# Patient Record
Sex: Female | Born: 1990 | Race: Black or African American | Hispanic: No | Marital: Single | Smoking: Former smoker
Health system: Southern US, Community
[De-identification: ages and names within clinical notes are randomized; demographics above are authoritative.]

## PROBLEM LIST (undated history)

## (undated) ENCOUNTER — Emergency Department (HOSPITAL_COMMUNITY): Admission: EM | Payer: Medicaid Other

## (undated) DIAGNOSIS — J45909 Unspecified asthma, uncomplicated: Secondary | ICD-10-CM

## (undated) HISTORY — PX: WISDOM TOOTH EXTRACTION: SHX21

## (undated) HISTORY — DX: Unspecified asthma, uncomplicated: J45.909

## (undated) HISTORY — PX: INCISION AND DRAINAGE: SHX5863

---

## 2019-08-08 HISTORY — PX: INCISION AND DRAINAGE ABSCESS: SHX5864

## 2020-08-28 DIAGNOSIS — S83519A Sprain of anterior cruciate ligament of unspecified knee, initial encounter: Secondary | ICD-10-CM

## 2020-08-28 HISTORY — DX: Sprain of anterior cruciate ligament of unspecified knee, initial encounter: S83.519A

## 2020-09-07 DIAGNOSIS — I2699 Other pulmonary embolism without acute cor pulmonale: Secondary | ICD-10-CM

## 2020-09-07 HISTORY — DX: Other pulmonary embolism without acute cor pulmonale: I26.99

## 2020-09-27 DIAGNOSIS — I749 Embolism and thrombosis of unspecified artery: Secondary | ICD-10-CM

## 2020-09-27 HISTORY — DX: Embolism and thrombosis of unspecified artery: I74.9

## 2021-06-02 ENCOUNTER — Other Ambulatory Visit: Payer: Self-pay

## 2021-06-02 ENCOUNTER — Encounter (HOSPITAL_COMMUNITY): Payer: Self-pay

## 2021-06-02 ENCOUNTER — Emergency Department (HOSPITAL_COMMUNITY)
Admission: EM | Admit: 2021-06-02 | Discharge: 2021-06-02 | Disposition: A | Discharge status: Home / Self Care | Payer: Self-pay | Attending: Emergency Medicine | Admitting: Emergency Medicine

## 2021-06-02 DIAGNOSIS — Z28311 Partially vaccinated for covid-19: Secondary | ICD-10-CM | POA: Insufficient documentation

## 2021-06-02 DIAGNOSIS — J101 Influenza due to other identified influenza virus with other respiratory manifestations: Secondary | ICD-10-CM | POA: Insufficient documentation

## 2021-06-02 DIAGNOSIS — Z20822 Contact with and (suspected) exposure to covid-19: Secondary | ICD-10-CM | POA: Insufficient documentation

## 2021-06-02 LAB — RESP PANEL BY RT-PCR (FLU A&B, COVID) ARPGX2
Influenza A by PCR: POSITIVE — AB
Influenza B by PCR: NEGATIVE
SARS Coronavirus 2 by RT PCR: NEGATIVE

## 2021-06-02 LAB — GROUP A STREP BY PCR: Group A Strep by PCR: NOT DETECTED

## 2021-06-02 MED ORDER — OSELTAMIVIR PHOSPHATE 75 MG PO CAPS: 75.0000 mg | ORAL_CAPSULE | Freq: Two times a day (BID) | ORAL | 0 refills | Status: DC

## 2021-06-02 MED ORDER — BENZONATATE 100 MG PO CAPS: 100.0000 mg | ORAL_CAPSULE | Freq: Three times a day (TID) | ORAL | 0 refills | Status: DC

## 2021-06-02 NOTE — ED Provider Notes (Signed)
[facility]-EMERGENCY DEPT Provider Note   CSN: [id number] Arrival date & time: 06/02/21  1901     History Chief Complaint  Patient presents with   Generalized Body Aches   Sore Throat   Nasal Congestion   Headache    Rebecca Holloway is a 30 y.o. female.  The history is provided by the patient. No language interpreter was used.  Sore Throat Associated symptoms include headaches.  Headache  30 year old female who presents with cold symptoms.  Patient report for the past 2 to 3 days she has had subjective fever, chills, body aches, sinus congestion, sore throat, nonproductive cough, and generalized fatigues.  She denies having nausea vomiting diarrhea or dysuria.  No recent sick contact.  She has tried gargle with salt water and taking over-the-counter medication without adequate relief.  Symptoms are moderate in severity.  She has had 1 COVID vaccination the past.  She denies having shortness of breath.   History reviewed. No pertinent past medical history.  There are no problems to display for this patient.   The histories are not reviewed yet. Please review them in the "History" navigator section and refresh this SmartLink.   OB History   No obstetric history on file.     History reviewed. No pertinent family history.     Home Medications Prior to Admission medications   Not on File    Allergies    Patient has no allergy information on record.  Review of Systems   Review of Systems  Neurological:  Positive for headaches.  All other systems reviewed and are negative.  Physical Exam Updated Vital Signs BP 130/89   Pulse 100   Temp 99.2 F (37.3 C) (Oral)   Resp 18   SpO2 100%   Physical Exam Vitals and nursing note reviewed.  Constitutional:      General: She is not in acute distress.    Appearance: She is well-developed.  HENT:     Head: Atraumatic.     Right Ear: Tympanic membrane normal.     Left Ear: Tympanic membrane normal.      Nose: Congestion present. No rhinorrhea.     Mouth/Throat:     Mouth: Mucous membranes are moist.     Pharynx: Uvula midline. No oropharyngeal exudate, posterior oropharyngeal erythema or uvula swelling.     Tonsils: No tonsillar exudate or tonsillar abscesses.  Eyes:     Conjunctiva/sclera: Conjunctivae normal.  Cardiovascular:     Rate and Rhythm: Normal rate and regular rhythm.  Pulmonary:     Effort: Pulmonary effort is normal.     Breath sounds: No wheezing, rhonchi or rales.  Abdominal:     Palpations: Abdomen is soft.  Musculoskeletal:     Cervical back: Neck supple.  Skin:    Findings: No rash.  Neurological:     Mental Status: She is alert.  Psychiatric:        Mood and Affect: Mood normal.    ED Results / Procedures / Treatments   Labs (all labs ordered are listed, but only abnormal results are displayed) Labs Reviewed  RESP PANEL BY RT-PCR (FLU A&B, COVID) ARPGX2 - Abnormal; Notable for the following components:      Result Value   Influenza A by PCR POSITIVE (*)    All other components within normal limits  GROUP A STREP BY PCR    EKG None  Radiology No results found.  Procedures Procedures   Medications Ordered in ED Medications -  No data to display  ED Course  I have reviewed the triage vital signs and the nursing notes.  Pertinent labs & imaging results that were available during my care of the patient were reviewed by me and considered in my medical decision making (see chart for details).    MDM Rules/Calculators/A&P                           BP 130/89   Pulse 100   Temp 99.2 F (37.3 C) (Oral)   Resp 18   SpO2 100%   Final Clinical Impression(s) / ED Diagnoses Final diagnoses:  Influenza A    Rx / DC Orders ED Discharge Orders          Ordered    oseltamivir (TAMIFLU) 75 MG capsule  Every 12 hours        06/02/21 2129    benzonatate (TESSALON) 100 MG capsule  Every 8 hours        06/02/21 2129           Patient  here with cold symptoms.  Throat exam unremarkable no evidence concerning for deep tissue infection.  Will obtain strep and viral respiratory panel test.  9:28 PM Test positive for influenza A.  Will prescribe Tamiflu.  Patient otherwise stable for discharge   Otho Perl 06/02/21 2130    Mancel Bale, MD 06/03/21 [phone]

## 2021-06-02 NOTE — Discharge Instructions (Signed)
You have tested positive for Influenza A.  Take medications prescribed.  Drink plenty of fluid, take tylenol or ibuprofen as needed for aches and pain.  Get adequate rest.

## 2021-06-02 NOTE — ED Triage Notes (Signed)
Pt complains of sore throat, runny nose, headache, and body aches x 2 days.

## 2021-06-02 NOTE — ED Provider Notes (Signed)
Emergency Medicine Provider Triage Evaluation Note  Rebecca Holloway , a 30 y.o. female  was evaluated in triage.  Pt complains of sore throat and rhinorrhea.  Review of Systems  Positive: Sore throat, rhinorrhea Negative: cough  Physical Exam  BP 130/89   Pulse 100   Temp 99.2 F (37.3 C) (Oral)   Resp 18   SpO2 100%  Gen:   Awake, no distress   Resp:  Normal effort  MSK:   Moves extremities without difficulty  Other:  No pta  Medical Decision Making  Medically screening exam initiated at 7:31 PM.  Appropriate orders placed.  Cohen Doleman was informed that the remainder of the evaluation will be completed by another provider, this initial triage assessment does not replace that evaluation, and the importance of remaining in the ED until their evaluation is complete.     Rebecca Holloway 06/02/21 1932    Jacalyn Lefevre, MD 06/02/21 [phone]

## 2021-08-07 NOTE — L&D Delivery Note (Addendum)
OB/GYN Faculty Practice Delivery Note  Rebecca Holloway is a 31 y.o. G1P0 s/p VD at [redacted]w[redacted]d. She was admitted for IOL due to GDM.   ROM: 28h 36m with clear fluid GBS Status: positive s/p PCN Maximum Maternal Temperature: 99.9F  Labor Progress: IOL progressed with AROM, pitocin, and misoprostol to complete.  Delivery Date/Time: 11:20 Delivery: Called to room and patient was complete and pushing. Head delivered LOA. No nuchal cord present. Shoulder and body delivered in usual fashion. Infant with spontaneous cry, placed on mother's abdomen, dried and stimulated. Cord clamped x 2 after 1-minute delay, and cut by Dr. Nobie Putnam. Cord blood drawn. Placenta delivered spontaneously, intact, with 3-vessel cord. Fundus firm with massage and Pitocin. Labia, perineum, vagina, and cervix inspected, no laceration found, small anterior left side labial abrasion not requiring sutures.  Placenta: Intact Complications: none Lacerations: none EBL: 61 Analgesia: Epidural  Infant: female  APGARs 7, 9  Celine Mans, MD 04/21/2022, 11:43 AM   Fellow ATTESTATION  I was present and gloved for this delivery and agree with the above documentation in the resident's note except as below.  Celedonio Savage, MD [facility] [employer] [facility] (Faculty Practice) 04/21/2022, 2:44 PM

## 2021-10-25 ENCOUNTER — Ambulatory Visit (INDEPENDENT_AMBULATORY_CARE_PROVIDER_SITE_OTHER): Payer: Managed Care, Other (non HMO) | Admitting: Licensed Clinical Social Worker

## 2021-10-25 ENCOUNTER — Ambulatory Visit (INDEPENDENT_AMBULATORY_CARE_PROVIDER_SITE_OTHER): Payer: Medicaid Other

## 2021-10-25 ENCOUNTER — Other Ambulatory Visit: Payer: Self-pay

## 2021-10-25 VITALS — BP 132/84 | HR 92 | Ht 60.0 in | Wt 236.2 lb

## 2021-10-25 DIAGNOSIS — F439 Reaction to severe stress, unspecified: Secondary | ICD-10-CM | POA: Diagnosis not present

## 2021-10-25 DIAGNOSIS — O0991 Supervision of high risk pregnancy, unspecified, first trimester: Secondary | ICD-10-CM

## 2021-10-25 DIAGNOSIS — O0990 Supervision of high risk pregnancy, unspecified, unspecified trimester: Secondary | ICD-10-CM

## 2021-10-25 DIAGNOSIS — Z3A12 12 weeks gestation of pregnancy: Secondary | ICD-10-CM

## 2021-10-25 MED ORDER — GOJJI WEIGHT SCALE MISC
1.0000 | 0 refills | Status: DC
Start: 2021-10-25 — End: 2022-04-21

## 2021-10-25 MED ORDER — PREPLUS 27-1 MG PO TABS: 1.0000 | ORAL_TABLET | Freq: Every day | ORAL | 13 refills | Status: DC

## 2021-10-25 MED ORDER — BLOOD PRESSURE KIT DEVI: 1.0000 | 0 refills | Status: DC

## 2021-10-25 NOTE — Progress Notes (Cosign Needed)
Patient presents for New OB intake. Patient established NEW OB care in [facility] and relocated to this area about 1 month. Dating scan completed on 2/4 and was [redacted]w[redacted]d at that time. See Care Everywhere. Doppler today. No repeat dating scan completed. ? 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 This was started by provider in [facility] by Dr. Theda Sers. ? ?Patient is currently living in a hotel and is in need of housing due to financial resources being limited. She has [facility], but was told that they would not accept her until she is 14 weeks. ? ?She also needs transportation support. Patient informed that Medicaid offers transportation to and from appointments and to contact them for future transportation needs to appointments and home. ? ?PHQ9 score: 13 ?GAD 7 score: 18 ? ? ? ?

## 2021-10-25 NOTE — Progress Notes (Cosign Needed)
New OB Intake ? ?I connected with  Rebecca Holloway on 10/25/21 at  1:00 PM EDT by in person and verified that I am speaking with the correct person using two identifiers. Nurse is located at [facility] and pt is located at [place]. ? ?I discussed the limitations, risks, security and privacy concerns of performing an evaluation and management service by telephone and the availability of in person appointments. I also discussed with the patient that there may be a patient responsible charge related to this service. The patient expressed understanding and agreed to proceed. ? ?I explained I am completing New OB Intake today. We discussed her EDD of 05/08/22 that is based on LMP of 08/01/21. Pt is G1/P0. I reviewed her allergies, medications, Medical/Surgical/OB history, and appropriate screenings. I informed her of [facility] services. Based on history, this is a/an  pregnancy complicated by hx of PE currently taking lovenox.  .  ? ?There are no problems to display for this patient. ? ? ?Concerns addressed today ? ?Delivery Plans:  ?Plans to deliver at [facility].  ? ?MyChart/Babyscripts ?MyChart access verified. I explained pt will have some visits in office and some virtually. Babyscripts instructions given and order placed. Patient verifies receipt of registration text/e-mail. Account successfully created and app downloaded. ? ?Blood Pressure Cuff  ?Blood pressure cuff ordered for patient to pick-up from [employer]. Explained after first prenatal appt pt will check weekly and document in Babyscripts. ? ?Weight scale: Patient does have weight scale. Weight scale ordered for patient to pick up from [employer].  ? ?Anatomy US ?Explained first scheduled [place] will be around 19 weeks. Dating and viability scan performed today. ? ?Labs ?Discussed Avelina Laine genetic screening with patient. Would like both Panorama and Horizon drawn at new OB visit.Also if interested in genetic testing, tell patient she will need AFP 15-21 weeks to  complete genetic testing .Routine prenatal labs needed. ? ?Covid Vaccine ?Patient has covid vaccine. Received 1 dose ? ? ?Informed patient of [facility] website  and placed link in her AVS.  ? ?Social Determinants of Health ?Food Insecurity: Patient denies food insecurity. ?WIC Referral: Patient is interested in referral to [facility].  ?Transportation: Patient expressed transportation needs. Transportation Services reviewed with patient; patient registered and phone number provided for patient to schedule rides. ?Childcare: Discussed no children allowed at ultrasound appointments. Offered childcare services; patient declines childcare services at this time. ? ?Send link to Pregnancy Navigators ? ? ?Placed OB Box on problem list and updated ? ?First visit review ?I reviewed new OB appt with pt. I explained she will have a pelvic exam, ob bloodwork with genetic screening, and PAP smear. Explained pt will be seen by Nettie Elm at first visit; encounter routed to appropriate provider. Explained that patient will be seen by pregnancy navigator following visit with provider. [facility] information placed in AVS.  ? ?Hamilton Capri, RN ?10/25/2021  12:55 PM  ?

## 2021-10-26 ENCOUNTER — Telehealth: Payer: Self-pay

## 2021-10-26 ENCOUNTER — Encounter (HOSPITAL_COMMUNITY): Payer: Self-pay | Admitting: Obstetrics & Gynecology

## 2021-10-26 ENCOUNTER — Inpatient Hospital Stay (HOSPITAL_COMMUNITY)
Admission: AD | Admit: 2021-10-26 | Discharge: 2021-10-26 | Disposition: A | Discharge status: Home / Self Care | Payer: Commercial Managed Care - HMO | Attending: Obstetrics & Gynecology | Admitting: Obstetrics & Gynecology

## 2021-10-26 DIAGNOSIS — Z3A12 12 weeks gestation of pregnancy: Secondary | ICD-10-CM | POA: Diagnosis not present

## 2021-10-26 DIAGNOSIS — I2699 Other pulmonary embolism without acute cor pulmonale: Secondary | ICD-10-CM

## 2021-10-26 DIAGNOSIS — O26891 Other specified pregnancy related conditions, first trimester: Secondary | ICD-10-CM | POA: Diagnosis not present

## 2021-10-26 DIAGNOSIS — O26851 Spotting complicating pregnancy, first trimester: Secondary | ICD-10-CM | POA: Diagnosis present

## 2021-10-26 DIAGNOSIS — Z7901 Long term (current) use of anticoagulants: Secondary | ICD-10-CM | POA: Insufficient documentation

## 2021-10-26 DIAGNOSIS — Z3491 Encounter for supervision of normal pregnancy, unspecified, first trimester: Secondary | ICD-10-CM

## 2021-10-26 DIAGNOSIS — R319 Hematuria, unspecified: Secondary | ICD-10-CM | POA: Diagnosis not present

## 2021-10-26 LAB — URINALYSIS, ROUTINE W REFLEX MICROSCOPIC
Bilirubin Urine: NEGATIVE
Glucose, UA: NEGATIVE mg/dL
Ketones, ur: NEGATIVE mg/dL
Nitrite: NEGATIVE
Protein, ur: NEGATIVE mg/dL
Specific Gravity, Urine: 1.014 (ref 1.005–1.030)
Squamous Epithelial / HPF: >50 — ABNORMAL HIGH (ref 0–5)
pH: 5 (ref 5.0–8.0)

## 2021-10-26 LAB — WET PREP, GENITAL
Clue Cells Wet Prep HPF POC: NONE SEEN
Sperm: NONE SEEN
Trich, Wet Prep: NONE SEEN
WBC, Wet Prep HPF POC: >=10 — AB (ref <10)
Yeast Wet Prep HPF POC: NONE SEEN

## 2021-10-26 NOTE — MAU Provider Note (Signed)
?History  ?  ? ?CSN: [id number] ? ?Arrival date and time: 10/26/21 1242 ? ? Event Date/Time  ? First Provider Initiated Contact with Patient 10/26/21 1316   ?  ? ?Chief Complaint  ?Patient presents with  ? Vaginal Bleeding  ? ?HPI ?Rebecca Holloway is a 31 y.o. G1P0 at 65w2dwho presents stating she saw one small blood clot that was smaller than a pea in the toilet at 1030. She has not had any more bleeding. She denies any pain. She is concerned because she is on Lovenox and saw the bleeding.  ? ?OB History   ? ? Gravida  ?1  ? Para  ?   ? Term  ?   ? Preterm  ?   ? AB  ?   ? Living  ?0  ?  ? ? SAB  ?   ? IAB  ?   ? Ectopic  ?   ? Multiple  ?   ? Live Births  ?   ?   ?  ?  ? ? ?Past Medical History:  ?Diagnosis Date  ? ACL tear 08/28/2020  ? Right Knee  ? Asthma   ? [place]  ? left leg  ? [place] 09[place]  ? right lung  ? ? ?Past Surgical History:  ?Procedure Laterality Date  ? INCISION AND DRAINAGE Right   ? glute  ? INCISION AND DRAINAGE ABSCESS Right 2021  ? right axilla  ? ? ?Family History  ?Problem Relation Age of Onset  ? Kidney failure Mother   ? Cancer Father   ? Breast cancer Paternal Grandmother   ? ? ?Social History  ? ?Tobacco Use  ? Smoking status: Former  ?  Packs/day: 0.50  ?  Types: Cigarettes  ?  Quit date: 09/07/2021  ?  Years since quitting: 0.1  ? Smokeless tobacco: Never  ?Vaping Use  ? Vaping Use: Never used  ?Substance Use Topics  ? Alcohol use: Not Currently  ?  Comment: daily, prior to pregnancy  ? Drug use: Not Currently  ?  Types: Marijuana  ?  Comment: daily, prior to pregnancy  ? ? ?Allergies:  ?Allergies  ?Allergen Reactions  ? Ceftriaxone Other (See Comments) and Shortness Of Breath  ?  Other reaction(s): Seizure ?Seizure and HTN ?  ? ? ?Medications Prior to Admission  ?Medication Sig Dispense Refill Last Dose  ? enoxaparin (LOVENOX) 40 MG/0.4ML injection enoxaparin 40 mg/0.4 mL subcutaneous syringe   10/25/2021  ? Blood Pressure Monitoring (BLOOD PRESSURE KIT)  DEVI 1 kit by Does not apply route once a week. 1 each 0   ? Misc. Devices (GOJJI WEIGHT SCALE) MISC 1 Device by Does not apply route every 30 (thirty) days. 1 each 0   ? Prenatal Vit-Fe Fumarate-FA (PREPLUS) 27-1 MG TABS Take 1 tablet by mouth daily. 30 tablet 13   ? ? ?Review of Systems  ?Constitutional: Negative.  Negative for fatigue and fever.  ?HENT: Negative.    ?Respiratory: Negative.  Negative for shortness of breath.   ?Cardiovascular: Negative.  Negative for chest pain.  ?Gastrointestinal: Negative.  Negative for abdominal pain, constipation, diarrhea, nausea and vomiting.  ?Genitourinary:  Positive for vaginal bleeding. Negative for dysuria and vaginal discharge.  ?Neurological: Negative.  Negative for dizziness and headaches.  ?Physical Exam  ? ?Blood pressure 140/88, pulse 93, temperature 98.8 ?F (37.1 ?C), temperature source Oral, resp. rate 18, height 5' (1.524 m), weight 106.5 kg, last menstrual period 08/01/2021, SpO2  99 %. ? ?Physical Exam ?Vitals and nursing note reviewed.  ?Constitutional:   ?   General: She is not in acute distress. ?   Appearance: She is well-developed.  ?HENT:  ?   Head: Normocephalic.  ?Eyes:  ?   Pupils: Pupils are equal, round, and reactive to light.  ?Cardiovascular:  ?   Rate and Rhythm: Normal rate and regular rhythm.  ?   Heart sounds: Normal heart sounds.  ?Pulmonary:  ?   Effort: Pulmonary effort is normal. No respiratory distress.  ?   Breath sounds: Normal breath sounds.  ?Abdominal:  ?   General: Bowel sounds are normal. There is no distension.  ?   Palpations: Abdomen is soft.  ?   Tenderness: There is no abdominal tenderness.  ?Genitourinary: ?   Comments: No blood on SSE ?Skin: ?   General: Skin is warm and dry.  ?Neurological:  ?   Mental Status: She is alert and oriented to person, place, and time.  ?Psychiatric:     ?   Mood and Affect: Mood normal.     ?   Behavior: Behavior normal.     ?   Thought Content: Thought content normal.     ?   Judgment: Judgment  normal.  ? ? ?MAU Course  ?Procedures ?Results for orders placed or performed during the hospital encounter of 10/26/21 (from the past 24 hour(s))  ?Urinalysis, Routine w reflex microscopic     Status: Abnormal  ? Collection Time: 10/26/21  1:29 PM  ?Result Value Ref Range  ? Color, Urine AMBER (A) YELLOW  ? APPearance CLOUDY (A) CLEAR  ? Specific Gravity, Urine 1.014 1.005 - 1.030  ? pH 5.0 5.0 - 8.0  ? Glucose, UA NEGATIVE NEGATIVE mg/dL  ? Hgb urine dipstick LARGE (A) NEGATIVE  ? Bilirubin Urine NEGATIVE NEGATIVE  ? Ketones, ur NEGATIVE NEGATIVE mg/dL  ? Protein, ur NEGATIVE NEGATIVE mg/dL  ? Nitrite NEGATIVE NEGATIVE  ? Leukocytes,Ua MODERATE (A) NEGATIVE  ? RBC / HPF 0-5 0 - 5 RBC/hpf  ? WBC, UA 11-20 0 - 5 WBC/hpf  ? Bacteria, UA MANY (A) NONE SEEN  ? Squamous Epithelial / LPF >50 (H) 0 - 5  ? WBC Clumps PRESENT   ? Mucus PRESENT   ?Wet prep, genital     Status: Abnormal  ? Collection Time: 10/26/21  1:38 PM  ?Result Value Ref Range  ? Yeast Wet Prep HPF POC NONE SEEN NONE SEEN  ? Trich, Wet Prep NONE SEEN NONE SEEN  ? Clue Cells Wet Prep HPF POC NONE SEEN NONE SEEN  ? WBC, Wet Prep HPF POC >=10 (A) <10  ? Sperm NONE SEEN   ?  ?MDM ?Pt informed that the ultrasound is considered a limited OB ultrasound and is not intended to be a complete ultrasound exam.  Patient also informed that the ultrasound is not being completed with the intent of assessing for fetal or placental anomalies or any pelvic abnormalities.  Explained that the purpose of today?s ultrasound is to assess for  viability.  Patient acknowledges the purpose of the exam and the limitations of the study.   ? ?Active fetus with FHR 154 bpm seen ? ?UA, UC ?Wet prep and gc/chlamydia ? ?Discussed with patient following up on UC and treating as needed. ? ?Assessment and Plan  ? ?1. Hematuria, unspecified type   ?2. [redacted] weeks gestation of pregnancy   ?3. Presence of fetal heart sounds in first trimester   ? ?-  Discharge home in stable condition ?-Vaginal  bleeding precautions discussed ?-Patient advised to follow-up with OB as scheduled for prenatal care ?-Patient may return to MAU as needed or if her condition were to change or worsen ? ? ?Wende Mott CNM ?10/26/2021, 1:16 PM  ?

## 2021-10-26 NOTE — BH Specialist Note (Signed)
Integrated Behavioral Health Initial In-Person Visit ? ?MRN: [id number] ?Name: Casaundra Takacs ? ?Number of Integrated Behavioral Health Clinician visits: No data recorded ?Session Start time: 0230 ?   ?Session End time: 0308 ? ?Total time in minutes: 38 ?In person at [facility]  ? ?Types of Service: Individual psychotherapy ? ?Interpretor:No. Interpretor Name and Language: none ? ? Warm Hand Off Completed. ?  ? ?  ? ? ?Subjective: ?Kayly Kriegel is a 31 y.o. female accompanied by n/a ?Patient was referred by A Burch RN for positive depression screen. ?Patient reports the following symptoms/concerns: insecure housing, depressed mood, lack of social and family support. ?Duration of problem: approx 2 months ; Severity of problem: mild ? ?Objective: ?Mood: average and Affect: approrpriateAppropriate ?Risk of harm to self or others: No plan to harm self or others ? ?Life Context: ?Family and Social: immediate family resides in [place], [place].  ?School/Work: temp work  ?Self-Care: n/a ?Life Changes: Relocated with a friend to [place]. Staying in hotel  ? ?Patient and/or Family's Strengths/Protective Factors: ?Sense of purpose ? ?Goals Addressed: ?Patient will: ?Reduce symptoms of: anxiety and stress ?Increase knowledge and/or ability of: coping skills  ?Demonstrate ability to: Increase adequate support systems for patient/family ? ?Progress towards Goals: ?Ongoing ? ?Interventions: ?Interventions utilized: Supportive Counseling and Link to [facility]  ?Standardized Assessments completed: PHQ 9 ? ?Patient and/or Family Response: Ms Blankenbeckler responded well to visit  ? ?Assessment: ?Patient currently experiencing situational stress . ?  ?Patient may benefit from community services. ? ?Plan: ?Follow up with behavioral health clinician on : as needed  ?Behavioral recommendations: contact community resources  ?Referral(s): [employer]:  Housing ? ? ? ?Gwyndolyn Saxon, LCSW ? ? ? ? ? ? ? ? ?

## 2021-10-26 NOTE — MAU Note (Signed)
Rebecca Holloway is a 31 y.o. at [redacted]w[redacted]d here in MAU reporting: saw a blood clot in the toilet,(smaller than a pea noted in picture) saw blood also when wiped. No pain.  Had bleeding early in preg- cause unknown.  +FH with doppler yesterday in office. Is on Lovenox due to hx of blood clot.  ? ?Onset of complaint: this morning ?Pain score: none ?Vitals:  ? 10/26/21 1257  ?BP: 140/88  ?Pulse: 93  ?Resp: 18  ?Temp: 98.8 ?F (37.1 ?C)  ?SpO2: 99%  ?   ?FHT- dress on ?Lab orders placed from triage:  none ?

## 2021-10-26 NOTE — Telephone Encounter (Signed)
Returned call, pt states that she has been having some spotting today and noticed small clots. Advised to be evaluated at hospital. ?Also left vm advising that [facility] is working to see if BP cuff is covered and will follow up with pt. ?

## 2021-10-26 NOTE — Discharge Instructions (Signed)

## 2021-10-27 LAB — CULTURE, OB URINE

## 2021-10-27 NOTE — Progress Notes (Signed)
Patient was assessed and managed by nursing staff during this encounter. I have reviewed the chart and agree with the documentation and plan. I have also made any necessary editorial changes. ? ?Zaineb Nowaczyk A Analy Bassford, MD ?10/27/2021 9:36 AM   ?

## 2021-10-28 ENCOUNTER — Telehealth: Payer: Self-pay | Admitting: Obstetrics and Gynecology

## 2021-10-28 NOTE — Telephone Encounter (Signed)
Babyscripts called with BP 140/88. This is same as what was in MAU on 3/22. She is 12w pregnant. May have CHTN.  ?1st appt on 4/10 with Dr. Alysia Penna.  ? ?Milas Hock, MD ?Attending Obstetrician & Gynecologist, Faculty Practice ?[facility] [employer], [facility] ? ? ?

## 2021-10-31 LAB — GC/CHLAMYDIA PROBE AMP (~~LOC~~) NOT AT ARMC
Chlamydia: NEGATIVE
Comment: NEGATIVE
Comment: NORMAL
Neisseria Gonorrhea: NEGATIVE

## 2021-11-02 ENCOUNTER — Encounter: Payer: Self-pay | Admitting: Obstetrics

## 2021-11-08 ENCOUNTER — Encounter: Payer: Self-pay | Admitting: *Deleted

## 2021-11-09 ENCOUNTER — Encounter: Payer: Self-pay | Admitting: *Deleted

## 2021-11-09 ENCOUNTER — Ambulatory Visit: Payer: Medicaid Other | Admitting: *Deleted

## 2021-11-09 VITALS — BP 121/72 | HR 96

## 2021-11-09 DIAGNOSIS — O0990 Supervision of high risk pregnancy, unspecified, unspecified trimester: Secondary | ICD-10-CM

## 2021-11-09 LAB — POCT URINALYSIS DIPSTICK
Bilirubin, UA: NEGATIVE
Blood, UA: NEGATIVE
Glucose, UA: NEGATIVE
Leukocytes, UA: NEGATIVE
Nitrite, UA: NEGATIVE
Protein, UA: NEGATIVE
Spec Grav, UA: 1.015 (ref 1.010–1.025)
Urobilinogen, UA: 0.2 E.U./dL
pH, UA: 5 (ref 5.0–8.0)

## 2021-11-09 MED ORDER — COMFORT FIT MATERNITY SUPP LG MISC: 0 refills | Status: DC

## 2021-11-09 NOTE — Progress Notes (Signed)
Rebecca Holloway presents for recollect urinalysis and OB urine culture. Pt was seen in MAU on 10/26/21 with complaints of passing small vaginal clot and of lower abdominal pain. Urine lab report indicates sample needs to be recollected for potential contamination with vaginal flora and skin cells. Patient continues to report right lower abdomen/ groin pain and some urinary frequency and urgency. Today's urine dip is normal. Urine sent for culture. Dr. Elly Modena consulted. Pain likely represents round ligament pain. Pt was advised on use of maternity support belt and tylenol as needed for pain. Pt reassured that provider would follow up on urine culture results and treat as needed. Pt to return 11/14/21 for New OB appt. Pt has not had a formal US with this pregnancy. Orders for OB US >14 wks placed for [place] with MFM as soon as possible. ?

## 2021-11-09 NOTE — Addendum Note (Signed)
Addended by: Catalina Antigua on: 11/09/2021 11:24 AM ? ? Modules accepted: Orders ? ?

## 2021-11-11 LAB — CULTURE, OB URINE

## 2021-11-11 LAB — URINE CULTURE, OB REFLEX

## 2021-11-14 ENCOUNTER — Ambulatory Visit (INDEPENDENT_AMBULATORY_CARE_PROVIDER_SITE_OTHER): Payer: Medicaid Other | Admitting: Obstetrics and Gynecology

## 2021-11-14 ENCOUNTER — Other Ambulatory Visit (HOSPITAL_COMMUNITY)
Admission: RE | Admit: 2021-11-14 | Discharge: 2021-11-14 | Disposition: A | Discharge status: Home / Self Care | Payer: Medicaid Other | Source: Ambulatory Visit | Attending: Obstetrics and Gynecology | Admitting: Obstetrics and Gynecology

## 2021-11-14 ENCOUNTER — Encounter: Payer: Self-pay | Admitting: Obstetrics and Gynecology

## 2021-11-14 VITALS — BP 117/79 | HR 90 | Wt 236.7 lb

## 2021-11-14 DIAGNOSIS — Z3A15 15 weeks gestation of pregnancy: Secondary | ICD-10-CM

## 2021-11-14 DIAGNOSIS — O0990 Supervision of high risk pregnancy, unspecified, unspecified trimester: Secondary | ICD-10-CM

## 2021-11-14 DIAGNOSIS — Z3480 Encounter for supervision of other normal pregnancy, unspecified trimester: Secondary | ICD-10-CM

## 2021-11-14 DIAGNOSIS — I2699 Other pulmonary embolism without acute cor pulmonale: Secondary | ICD-10-CM

## 2021-11-14 NOTE — Progress Notes (Signed)
Subjective:  ?Rebecca Holloway is a 31 y.o. G1P0 at [redacted]w[redacted]d being seen today for her first OB visit. EDD by LMP and confirmed by first trimester U/S. H/O PE/DVT ( Dx 2/22). Currently on Lovenox.   She is currently monitored for the following issues for this high-risk pregnancy and has Supervision of high risk pregnancy, antepartum and Pulmonary embolism ([place]) on their problem list. ? ?Patient reports no complaints.  Contractions: Irritability. Vag. Bleeding: None.   . Denies leaking of fluid.  ? ?The following portions of the patient's history were reviewed and updated as appropriate: allergies, current medications, past family history, past medical history, past social history, past surgical history and problem list. Problem list updated. ? ?Objective:  ? ?Vitals:  ? 11/14/21 0943  ?BP: 117/79  ?Pulse: 90  ?Weight: 236 lb 11.2 oz (107.4 kg)  ? ? ?Fetal Status: Fetal Heart Rate (bpm): 151        ? ?General:  Alert, oriented and cooperative. Patient is in no acute distress.  ?Skin: Skin is warm and dry. No rash noted.   ?Cardiovascular: Normal heart rate noted  ?Respiratory: Normal respiratory effort, no problems with respiration noted  ?Abdomen: Soft, gravid, appropriate for gestational age. Pain/Pressure: Present     ?Pelvic:  Cervical exam performed        ?Extremities: Normal range of motion.  Edema: None  ?Mental Status: Normal mood and affect. Normal behavior. Normal judgment and thought content.  ? ?Urinalysis:     ? ?Assessment and Plan:  ?Pregnancy: G1P0 at [redacted]w[redacted]d ? ?1. Supervision of high risk pregnancy, antepartum ?Prenatal care and labs reviewed with pt ?Genetic testing discussed ?Anatomy scan scheduled ? ?2. Pulmonary embolism, other, unspecified chronicity, unspecified whether acute cor pulmonale present ([place]) ?Stable ?Lovenox qd ? ?Preterm labor symptoms and general obstetric precautions including but not limited to vaginal bleeding, contractions, leaking of fluid and fetal movement were reviewed in detail with  the patient. ?Please refer to After Visit Summary for other counseling recommendations.  ?Return in about 4 weeks (around 12/12/2021) for OB visit, face to face, MD only. ? ? ?Chancy Milroy, MD ?

## 2021-11-14 NOTE — Progress Notes (Signed)
Pt in office for NOB visit. Pt requesting accomodation form for work. Pt requesting nausea medication.  ?

## 2021-11-14 NOTE — Addendum Note (Signed)
Addended by: Natale Milch D on: 11/14/2021 11:14 AM ? ? Modules accepted: Orders ? ?

## 2021-11-14 NOTE — Patient Instructions (Signed)

## 2021-11-15 ENCOUNTER — Encounter: Payer: Self-pay | Admitting: Obstetrics and Gynecology

## 2021-11-15 ENCOUNTER — Other Ambulatory Visit: Payer: Self-pay | Admitting: *Deleted

## 2021-11-15 ENCOUNTER — Other Ambulatory Visit: Payer: Self-pay | Admitting: Obstetrics and Gynecology

## 2021-11-15 LAB — CERVICOVAGINAL ANCILLARY ONLY
Bacterial Vaginitis (gardnerella): POSITIVE — AB
Candida Glabrata: NEGATIVE
Candida Vaginitis: NEGATIVE
Chlamydia: NEGATIVE
Comment: NEGATIVE
Comment: NEGATIVE
Comment: NEGATIVE
Comment: NEGATIVE
Comment: NEGATIVE
Comment: NORMAL
Neisseria Gonorrhea: NEGATIVE
Trichomonas: NEGATIVE

## 2021-11-15 LAB — CBC/D/PLT+RPR+RH+ABO+RUBIGG...
Antibody Screen: NEGATIVE
Basophils Absolute: 0 10*3/uL (ref 0.0–0.2)
Basos: 0 %
EOS (ABSOLUTE): 0.1 10*3/uL (ref 0.0–0.4)
Eos: 3 %
HCV Ab: NONREACTIVE
HIV Screen 4th Generation wRfx: NONREACTIVE
Hematocrit: 34.7 % (ref 34.0–46.6)
Hemoglobin: 12 g/dL (ref 11.1–15.9)
Hepatitis B Surface Ag: NEGATIVE
Immature Grans (Abs): 0 10*3/uL (ref 0.0–0.1)
Immature Granulocytes: 0 %
Lymphocytes Absolute: 1.9 10*3/uL (ref 0.7–3.1)
Lymphs: 34 %
MCH: 29 pg (ref 26.6–33.0)
MCHC: 34.6 g/dL (ref 31.5–35.7)
MCV: 84 fL (ref 79–97)
Monocytes Absolute: 0.7 10*3/uL (ref 0.1–0.9)
Monocytes: 12 %
Neutrophils Absolute: 2.7 10*3/uL (ref 1.4–7.0)
Neutrophils: 51 %
Platelets: 238 10*3/uL (ref 150–450)
RBC: 4.14 x10E6/uL (ref 3.77–5.28)
RDW: 13.1 % (ref 11.7–15.4)
RPR Ser Ql: NONREACTIVE
Rh Factor: POSITIVE
Rubella Antibodies, IGG: 1.34 index (ref >0.99)
WBC: 5.4 10*3/uL (ref 3.4–10.8)

## 2021-11-15 LAB — HEPATITIS C ANTIBODY: Hep C Virus Ab: NONREACTIVE

## 2021-11-15 LAB — HCV INTERPRETATION

## 2021-11-15 MED ORDER — METRONIDAZOLE 500 MG PO TABS: 500.0000 mg | ORAL_TABLET | Freq: Two times a day (BID) | ORAL | 0 refills | Status: DC

## 2021-11-15 MED ORDER — ONDANSETRON 4 MG PO TBDP: 4.0000 mg | ORAL_TABLET | Freq: Three times a day (TID) | ORAL | 0 refills | Status: DC | PRN

## 2021-11-15 NOTE — Progress Notes (Signed)
Flagyl sent for BV.

## 2021-11-15 NOTE — Progress Notes (Signed)
Zofran order placed per Dr Alysia Penna approval.  ?See email encounter.  ?

## 2021-11-17 ENCOUNTER — Encounter: Payer: Self-pay | Admitting: Obstetrics and Gynecology

## 2021-11-17 LAB — CYTOLOGY - PAP
Comment: NEGATIVE
Comment: NEGATIVE
Comment: NEGATIVE
Diagnosis: NEGATIVE
HPV 16: NEGATIVE
HPV 18 / 45: NEGATIVE
High risk HPV: POSITIVE — AB

## 2021-11-21 ENCOUNTER — Encounter: Payer: Self-pay | Admitting: Obstetrics and Gynecology

## 2021-11-23 ENCOUNTER — Other Ambulatory Visit: Payer: Self-pay | Admitting: Emergency Medicine

## 2021-11-23 ENCOUNTER — Telehealth: Payer: Self-pay | Admitting: Emergency Medicine

## 2021-11-23 NOTE — Telephone Encounter (Signed)
TC to patient to discuss horizon results. 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 No other questions at this time.  ?

## 2021-11-26 ENCOUNTER — Inpatient Hospital Stay (HOSPITAL_COMMUNITY)
Admission: AD | Admit: 2021-11-26 | Discharge: 2021-11-26 | Disposition: A | Discharge status: Home / Self Care | Payer: Medicaid Other | Attending: Obstetrics and Gynecology | Admitting: Obstetrics and Gynecology

## 2021-11-26 ENCOUNTER — Encounter (HOSPITAL_COMMUNITY): Payer: Self-pay | Admitting: Obstetrics and Gynecology

## 2021-11-26 DIAGNOSIS — M5441 Lumbago with sciatica, right side: Secondary | ICD-10-CM

## 2021-11-26 DIAGNOSIS — Z3A16 16 weeks gestation of pregnancy: Secondary | ICD-10-CM | POA: Insufficient documentation

## 2021-11-26 DIAGNOSIS — O26892 Other specified pregnancy related conditions, second trimester: Secondary | ICD-10-CM | POA: Diagnosis present

## 2021-11-26 LAB — URINALYSIS, ROUTINE W REFLEX MICROSCOPIC
Bilirubin Urine: NEGATIVE
Glucose, UA: NEGATIVE mg/dL
Hgb urine dipstick: NEGATIVE
Ketones, ur: NEGATIVE mg/dL
Leukocytes,Ua: NEGATIVE
Nitrite: NEGATIVE
Protein, ur: NEGATIVE mg/dL
Specific Gravity, Urine: 1.015 (ref 1.005–1.030)
pH: 5 (ref 5.0–8.0)

## 2021-11-26 MED ORDER — CYCLOBENZAPRINE HCL 5 MG PO TABS
10.0000 mg | ORAL_TABLET | Freq: Once | ORAL | Status: AC
  Administered 2021-11-26: 10 mg via ORAL
  Filled 2021-11-26: qty 2

## 2021-11-26 MED ORDER — CYCLOBENZAPRINE HCL 10 MG PO TABS: 10.0000 mg | ORAL_TABLET | Freq: Two times a day (BID) | ORAL | 0 refills | Status: DC | PRN

## 2021-11-26 NOTE — MAU Provider Note (Signed)
?History  ?  ? ?CSN: [id number] ? ?Arrival date and time: 11/26/21 2023 ? ? Event Date/Time  ? First Provider Initiated Contact with Patient 11/26/21 2208   ?  ? ?Chief Complaint  ?Patient presents with  ?? Hip Pain  ?? Back Pain  ? ?Rebecca Holloway is a 31 y.o. G1P0 at [redacted]w[redacted]d who receives care at [facility].  She presents today for Hip Pain and Back Pain. She states the pain has been "on and off for 3-4 days."  She states today around 4pm the pain got so bad it "brought me to my knees."  She states it sharp pain that starts in her hip and radiates to her right leg, vagina, and rectum.  She states the pain is not "all the time."  She reports that it lasts about 10-15 minutes during each occurrence.  ? ? ?OB History   ? ? Gravida  ?1  ? Para  ?   ? Term  ?   ? Preterm  ?   ? AB  ?   ? Living  ?0  ?  ? ? SAB  ?   ? IAB  ?   ? Ectopic  ?   ? Multiple  ?   ? Live Births  ?   ?   ?  ?  ? ? ?Past Medical History:  ?Diagnosis Date  ?? ACL tear 08/28/2020  ? Right Knee  ?? Asthma   ?? Embolism (HCC) 09/27/2020  ? left leg  ?? Pulmonary embolism (HCC) 09/2020  ? right lung  ? ? ?Past Surgical History:  ?Procedure Laterality Date  ?? INCISION AND DRAINAGE Right   ? glute  ?? INCISION AND DRAINAGE ABSCESS Right 2021  ? right axilla  ? ? ?Family History  ?Problem Relation Age of Onset  ?? Kidney failure Mother   ?? Cancer Father   ?? Breast cancer Paternal Grandmother   ? ? ?Social History  ? ?Tobacco Use  ?? Smoking status: Former  ?  Packs/day: 0.50  ?  Types: Cigarettes  ?  Quit date: 09/07/2021  ?  Years since quitting: 0.2  ?? Smokeless tobacco: Never  ?Vaping Use  ?? Vaping Use: Never used  ?Substance Use Topics  ?? Alcohol use: Not Currently  ?  Comment: daily, prior to pregnancy  ?? Drug use: Not Currently  ?  Types: Marijuana  ?  Comment: daily, prior to pregnancy  ? ? ?Allergies:  ?Allergies  ?Allergen Reactions  ?? Ceftriaxone Other (See Comments) and Shortness Of Breath  ?  Other reaction(s): Seizure ?Seizure and HTN ?   ? ? ?Medications Prior to Admission  ?Medication Sig Dispense Refill Last Dose  ?? enoxaparin (LOVENOX) 40 MG/0.4ML injection enoxaparin 40 mg/0.4 mL subcutaneous syringe   11/26/2021  ?? ondansetron (ZOFRAN-ODT) 4 MG disintegrating tablet Take 1 tablet (4 mg total) by mouth every 8 (eight) hours as needed for nausea or vomiting. 20 tablet 0 Past Week  ?? Prenatal Vit-Fe Fumarate-FA (PREPLUS) 27-1 MG TABS Take 1 tablet by mouth daily. 30 tablet 13 11/26/2021  ?? Blood Pressure Monitoring (BLOOD PRESSURE KIT) DEVI 1 kit by Does not apply route once a week. 1 each 0   ?? Elastic Bandages & Supports (COMFORT FIT MATERNITY SUPP LG) MISC Wear daily when ambulating 1 each 0   ?? metroNIDAZOLE (FLAGYL) 500 MG tablet Take 1 tablet (500 mg total) by mouth 2 (two) times daily. 14 tablet 0   ?? Misc. Devices (GOJJI WEIGHT SCALE) MISC   1 Device by Does not apply route every 30 (thirty) days. 1 each 0   ? ? ?Review of Systems  ?Constitutional:  Negative for chills and fever.  ?Gastrointestinal:  Positive for abdominal pain. Negative for constipation, diarrhea, nausea and vomiting.  ?Genitourinary:  Positive for pelvic pain (Pressure). Negative for difficulty urinating, dysuria, vaginal bleeding and vaginal discharge.  ?Musculoskeletal:  Positive for back pain.  ?Neurological:  Positive for dizziness and light-headedness. Negative for headaches.  ?Physical Exam  ? ?Blood pressure 134/80, pulse (!) 110, temperature 98.9 ?F (37.2 ?C), temperature source Oral, resp. rate 20, height 4' 11" (1.499 m), weight 110.3 kg, last menstrual period 08/01/2021, SpO2 100 %. ? ?Physical Exam ?Vitals reviewed.  ?Constitutional:   ?   General: She is not in acute distress. ?   Appearance: Normal appearance. She is obese. She is not ill-appearing.  ?HENT:  ?   Head: Normocephalic and atraumatic.  ?Eyes:  ?   Conjunctiva/sclera: Conjunctivae normal.  ?Cardiovascular:  ?   Rate and Rhythm: Normal rate and regular rhythm.  ?   Heart sounds: Normal heart  sounds.  ?Pulmonary:  ?   Effort: Pulmonary effort is normal. No respiratory distress.  ?   Breath sounds: Normal breath sounds.  ?Abdominal:  ?   General: Bowel sounds are normal.  ?   Palpations: Abdomen is soft.  ?Musculoskeletal:     ?   General: Normal range of motion.  ?   Cervical back: Normal range of motion.  ?Skin: ?   General: Skin is warm and dry.  ?Neurological:  ?   Mental Status: She is alert and oriented to person, place, and time.  ?Psychiatric:     ?   Mood and Affect: Mood normal.     ?   Behavior: Behavior normal.  ? ? ?MAU Course  ?Procedures ?Results for orders placed or performed during the hospital encounter of 11/26/21 (from the past 24 hour(s))  ?Urinalysis, Routine w reflex microscopic     Status: Abnormal  ? Collection Time: 11/26/21  8:51 PM  ?Result Value Ref Range  ? Color, Urine YELLOW YELLOW  ? APPearance HAZY (A) CLEAR  ? Specific Gravity, Urine 1.015 1.005 - 1.030  ? pH 5.0 5.0 - 8.0  ? Glucose, UA NEGATIVE NEGATIVE mg/dL  ? Hgb urine dipstick NEGATIVE NEGATIVE  ? Bilirubin Urine NEGATIVE NEGATIVE  ? Ketones, ur NEGATIVE NEGATIVE mg/dL  ? Protein, ur NEGATIVE NEGATIVE mg/dL  ? Nitrite NEGATIVE NEGATIVE  ? Leukocytes,Ua NEGATIVE NEGATIVE  ? ? ?MDM ?Exam ?Labs: UA ?Education ?Muscle Relaxant ?Assessment and Plan  ?31 year old, G1P0  ?SIUP at 16.5 weeks ?Back Pain with Sciatica ? ?-POC Reviewed ?-Exam performed and findings discussed.  ?-Educated on sciatica pain and reviewed interventions to help improve symptoms. ?-Encouraged to contact office, for PT referral, with worsening of symptoms. ?-Reassured that sciatica pain is an anticipated occurrence during pregnancy. Cautioned that symptoms may worsen. ?-Discussed c/o back pain. Reviewed interventions to improve symptoms including heat/cold compresses, medication, and back support brace. ?-Patient offered and accepts medication. ?-Will give flexeril 10 mg now. ?-Informed that if she has improvement in pain will give prescription.   ?-Patient agreeable and without questions. ? ?[name] ?11/26/2021, 10:08 PM  ? ?Reassessment (11:08 PM) ? ?-Patient with improvement in pain. ?-Rx for flexeril given as patient lives at [facility] [place]. ?-Patient also given voucher for cab/uber ride. ?-Follow up as scheduled. ?-Encouraged to call primary office or return to MAU  if symptoms worsen or with the onset of new symptoms. ?-Discharged to home in improved condition. ? ?Jessica L Emly MSN, CNM ?Advanced Practice Provider, [facility] ? ? ?

## 2021-11-26 NOTE — MAU Note (Signed)
..  Rebecca Holloway is a 31 y.o. at [redacted]w[redacted]d here in MAU reporting: intermittent sharp pain and cramping in her hip, back, and buttocks for the last 3 days, and today was worse the pt reports being sent home from work due to pain. Pt denies VB, LOF, abnormal discharge, and complication in the pregnancy.  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 Elevated BP being monitored.  ? ?Onset of complaint: 1600 ?Pain score: 10/10 ?Vitals:  ? 11/26/21 2040  ?BP: 134/80  ?Pulse: (!) 110  ?Resp: 20  ?Temp: 98.9 ?F (37.2 ?C)  ?SpO2: 100%  ?   ?[place]:[id number] by RN but unable to assess due to patient's pain  ?Lab orders placed from triage:  ua ? ? ?

## 2021-11-28 ENCOUNTER — Telehealth: Payer: Self-pay

## 2021-11-28 NOTE — Telephone Encounter (Signed)
Returned call and advised pt of recommendations from MAU visit on 4/22. Pt states that she does not have her rx yet, advised to take medication to help with her discomfort and discuss more with provider at appt, pt agreed. ?

## 2021-11-29 ENCOUNTER — Encounter: Payer: Self-pay | Admitting: Obstetrics and Gynecology

## 2021-12-12 ENCOUNTER — Telehealth: Payer: Self-pay

## 2021-12-12 NOTE — Telephone Encounter (Signed)
Written order for Breast Pump signed and faxed to [facility] 12/12/2021 ?

## 2021-12-13 ENCOUNTER — Encounter: Payer: Self-pay | Admitting: Obstetrics and Gynecology

## 2021-12-13 ENCOUNTER — Other Ambulatory Visit: Payer: Self-pay | Admitting: *Deleted

## 2021-12-13 ENCOUNTER — Ambulatory Visit: Payer: Medicaid Other | Attending: Obstetrics and Gynecology

## 2021-12-13 ENCOUNTER — Other Ambulatory Visit: Payer: Self-pay | Admitting: Obstetrics and Gynecology

## 2021-12-13 ENCOUNTER — Ambulatory Visit (INDEPENDENT_AMBULATORY_CARE_PROVIDER_SITE_OTHER): Payer: Medicaid Other | Admitting: Obstetrics and Gynecology

## 2021-12-13 ENCOUNTER — Encounter: Payer: Self-pay | Admitting: *Deleted

## 2021-12-13 ENCOUNTER — Ambulatory Visit: Payer: Medicaid Other | Admitting: *Deleted

## 2021-12-13 VITALS — BP 125/82 | HR 112 | Wt 244.0 lb

## 2021-12-13 VITALS — BP 111/74 | HR 97

## 2021-12-13 DIAGNOSIS — O0990 Supervision of high risk pregnancy, unspecified, unspecified trimester: Secondary | ICD-10-CM

## 2021-12-13 DIAGNOSIS — I2699 Other pulmonary embolism without acute cor pulmonale: Secondary | ICD-10-CM

## 2021-12-13 DIAGNOSIS — O283 Abnormal ultrasonic finding on antenatal screening of mother: Secondary | ICD-10-CM

## 2021-12-13 DIAGNOSIS — O99212 Obesity complicating pregnancy, second trimester: Secondary | ICD-10-CM

## 2021-12-13 IMAGING — US US MFM OB DETAIL+14 WK
1 series · 13 of 28 positions shown · non-contrast
Comparison: none

[Series 1: us mfm ob detail+14 wk · 127 acquisitions, 13 frames shown]
[im 5/127]
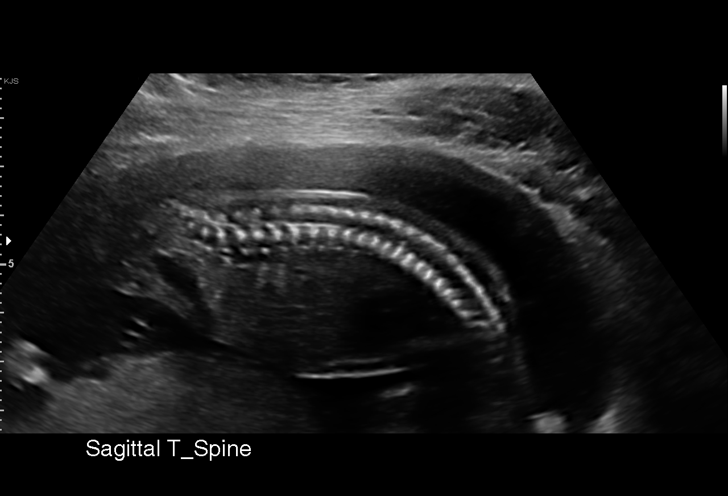
[im 15/127]
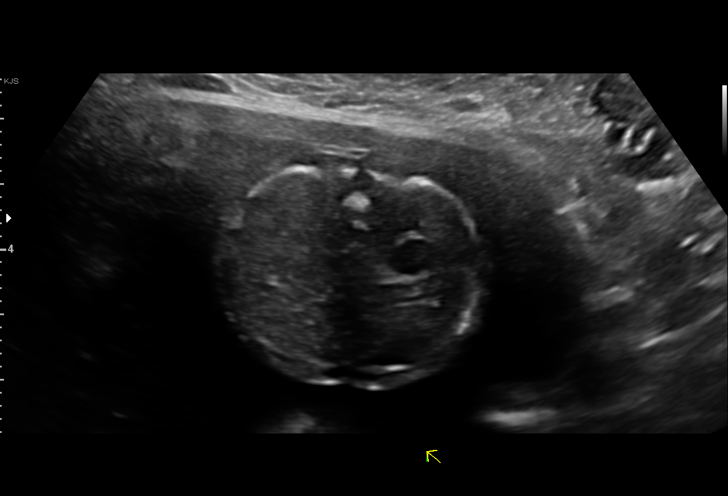
[im 24/127]
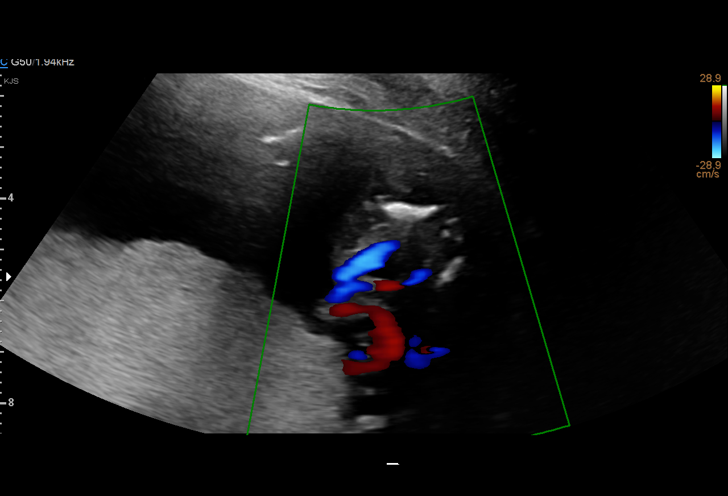
[im 33/127]
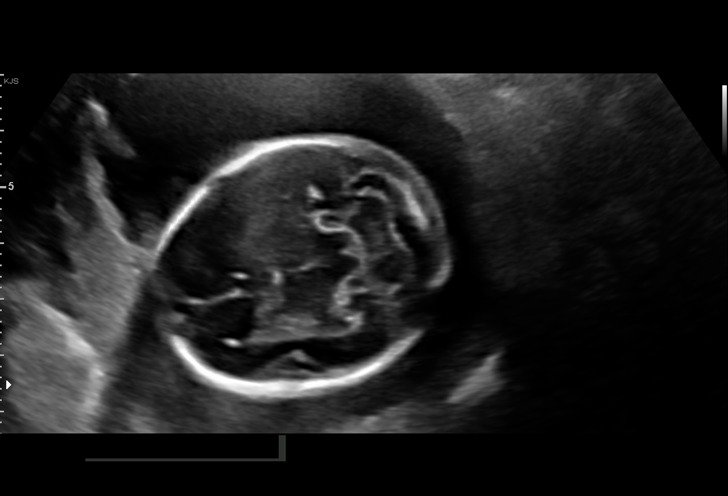
[im 43/127]
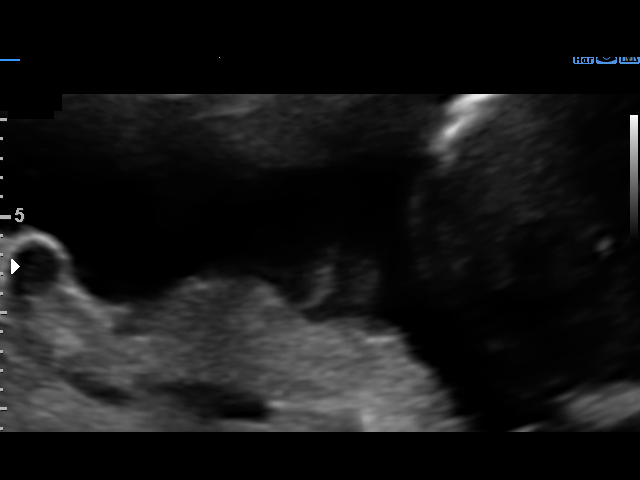
[im 52/127]
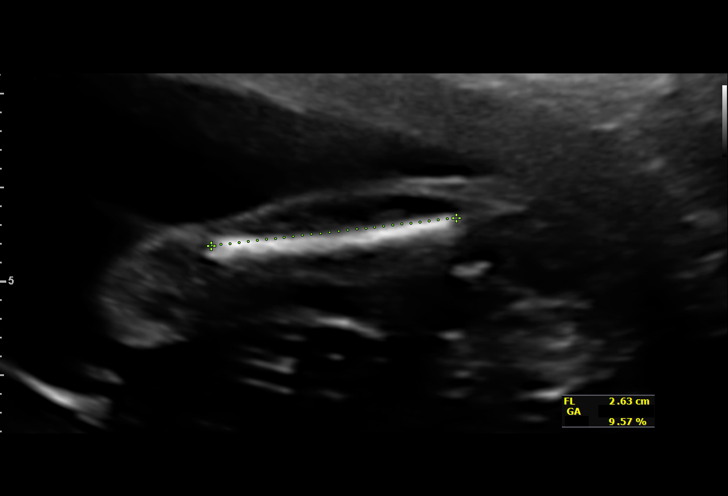
[im 66/127]
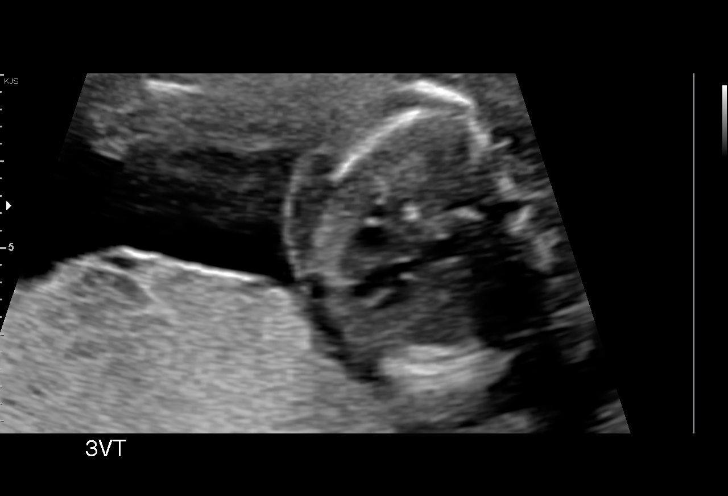
[im 75/127]
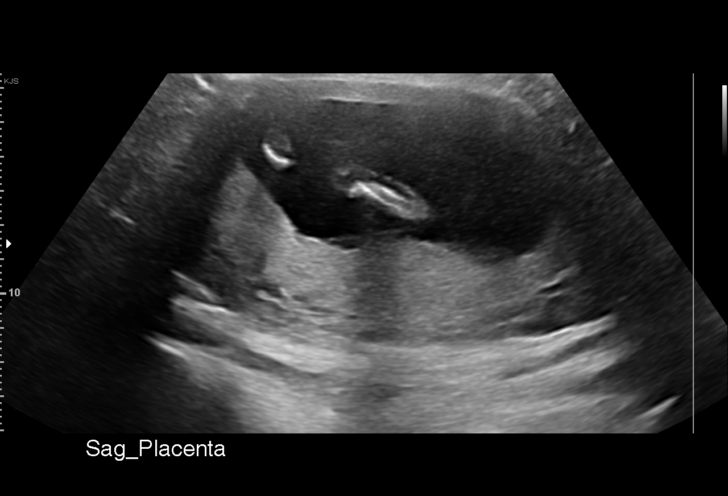
[im 85/127]
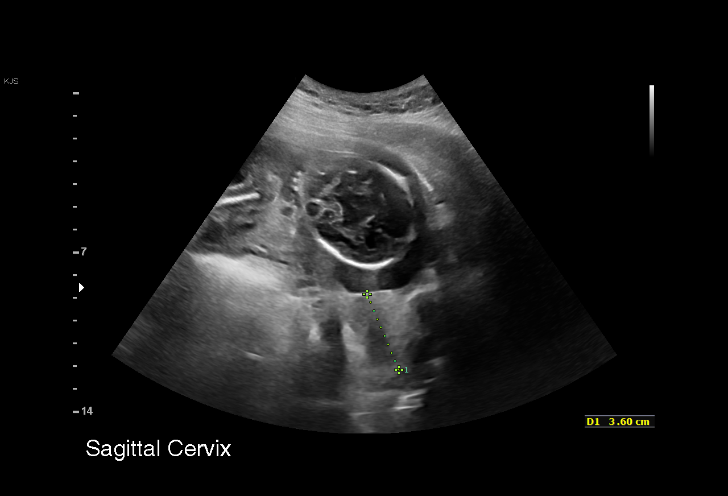
[im 94/127]
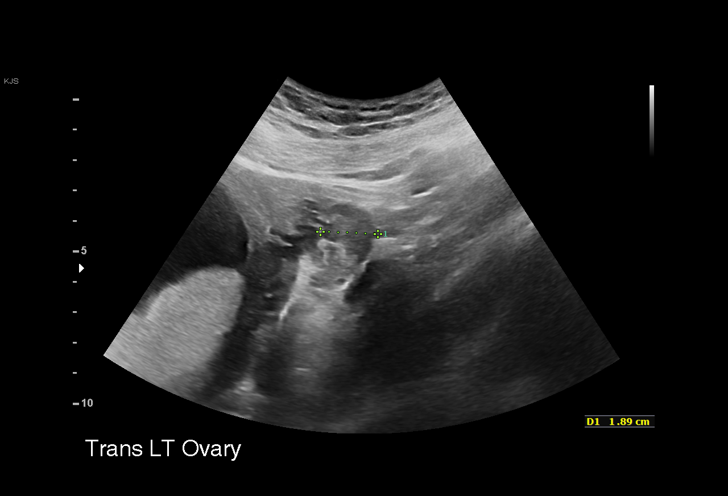
[im 103/127]
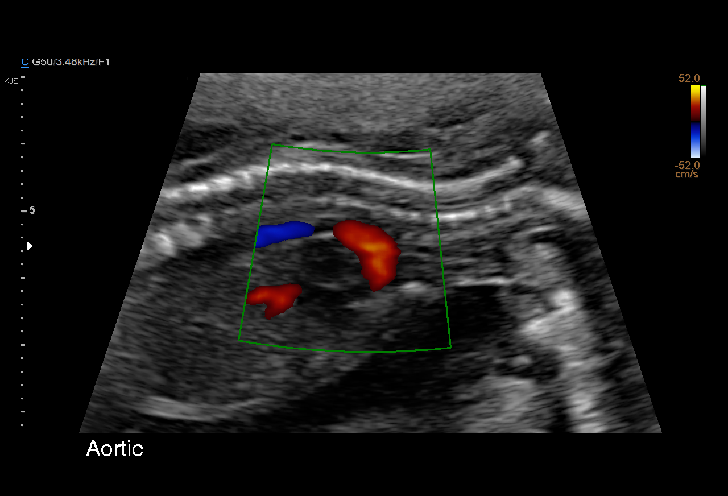
[im 113/127]
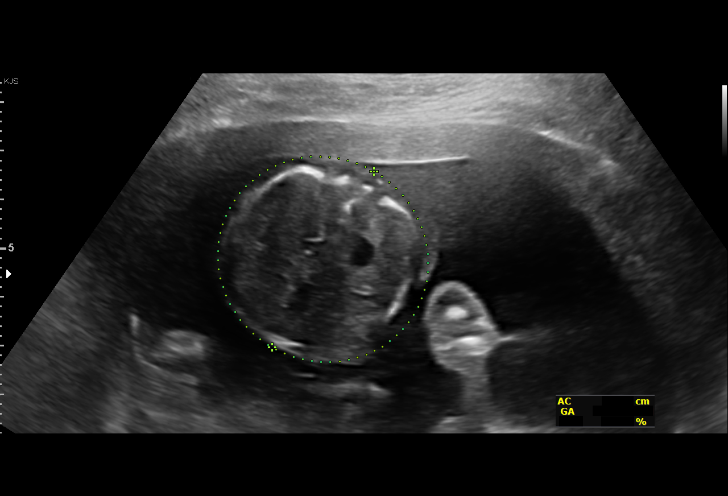
[im 122/127]
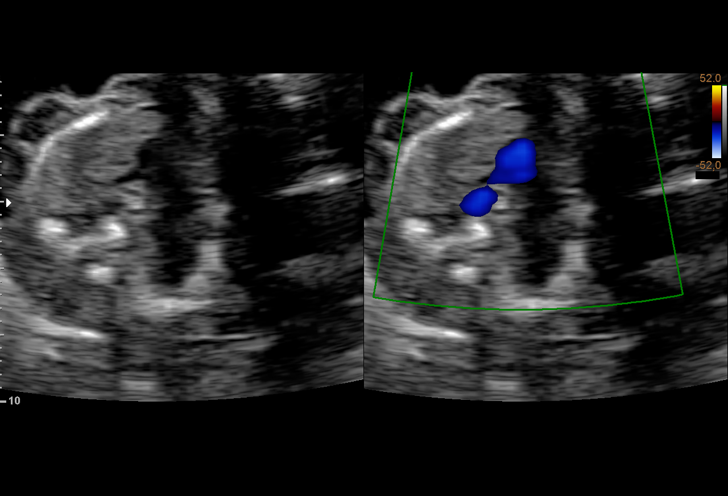

[13 of 28 positions shown; findings below may reference images not displayed]

[facility],                                 [HOSPITAL] at

Indications

 Obesity complicating pregnancy, second         [FT]
 trimester (pre-G BMI 44)
 Genetic carrier (Sickle cell)                  [FT]
 Echogenic intracardiac focus of the heart      [FT]
 (EIF)
 19 weeks gestation of pregnancy
 History of DVT / PE, currently on Lovenox
 LR NIPS - Female
Fetal Evaluation

 Num Of Fetuses:         1
 Fetal Heart Rate(bpm):  155
 Cardiac Activity:       Observed
 Presentation:           Cephalic
 Placenta:               Posterior
 P. Cord Insertion:      Visualized, central

 Amniotic Fluid
 AFI FV:      Within normal limits

                             Largest Pocket(cm)

Biometry

 BPD:        43  mm     G. Age:  19w 0d         45  %    CI:        74.85   %    70 - 86
                                                         FL/HC:      17.0   %    16.1 -
 HC:      157.7  mm     G. Age:  18w 5d         20  %    HC/AC:      1.19        1.09 -
 AC:      132.3  mm     G. Age:  18w 5d         32  %    FL/BPD:     62.3   %
 FL:       26.8  mm     G. Age:  18w 1d         13  %    FL/AC:      20.3   %    20 - 24
 HUM:      25.1  mm     G. Age:  17w 6d         14  %
 CER:      18.5  mm     G. Age:  18w 2d         10  %
 NFT:       3.3  mm

 LV:        5.7  mm
 CM:        4.1  mm

 Est. FW:     243  gm      0 lb 9 oz     15  %
OB History

 Gravidity:    1         Term:   0        Prem:   0        SAB:   0
 TOP:          0       Ectopic:  0        Living: 0
Gestational Age

 LMP:           19w 1d        Date:  [DATE]                 EDD:   [DATE]
 U/S Today:     18w 5d                                        EDD:   [DATE]
 Best:          19w 1d     Det. By:  LMP  ([DATE])          EDD:   [DATE]
Anatomy

 Cranium:               Appears normal         LVOT:                   Not well visualized
 Cavum:                 Appears normal         Aortic Arch:            Not well visualized
 Ventricles:            Appears normal         Ductal Arch:            Appears normal
 Choroid Plexus:        Appears normal         Diaphragm:              Appears normal
 Cerebellum:            Appears normal         Stomach:                Appears normal, left
                                                                       sided
 Posterior Fossa:       Appears normal         Abdomen:                Appears normal
 Nuchal Fold:           Appears normal         Abdominal Wall:         Appears nml (cord
                                                                       insert, abd wall)
 Face:                  Orbits nl; profile not Cord Vessels:           Appears normal (3
                        well visualized                                vessel cord)
 Lips:                  Appears normal         Kidneys:                Appear normal
 Palate:                Not well visualized    Bladder:                Appears normal
 Thoracic:              Appears normal         Spine:                  Appears normal
 Heart:                 Not well visualized,   Upper Extremities:      Appears normal
                        EIF
 RVOT:                  Not well visualized    Lower Extremities:      Appears normal

 Other:  Fetus appears to be female. Heels/feet and open hands/5th digits,
         Nasal bone, lenses, VC, 3VV visualized. Technically difficult due to
         maternal habitus and fetal position.
Cervix Uterus Adnexa

 Cervix
 Length:           3.68  cm.
 Normal appearance by transabdominal scan.

 Uterus
 No abnormality visualized.

 Right Ovary
 Within normal limits.

 Left Ovary
 Within normal limits.
 Cul De Sac
 No free fluid seen.

 Adnexa
 No adnexal mass visualized.
Impression

 Single intrauterine pregnancy here for a detailed anatomy
 due to elevated BMI and history of an PE/DVT on Lovenox.
 Normal anatomy with measurements consistent with dates
 There is good fetal movement and amniotic fluid volume
 Suboptimal views of the fetal anatomy were obtained
 secondary to fetal position.

 An echogenic intracardiac focus was observed today. I
 discussed that there is no increased risk to the function or
 structure of the heart. In addition, in the context of a low risk
 NIPS result the risk for aneuploidy are reduced. There were
 no additional markers of aneuploidy observed.  I reviewed
 that an ultrasound is a screening exam and that a diagnostic
 exam via amnioticenetesis is the only test available to provide
 a definitive result.

Recommendations

 Follow up growth and anatomy in 4 weeks.

## 2021-12-13 NOTE — Progress Notes (Signed)
? ?  PRENATAL VISIT NOTE ? ?Subjective:  ?Rebecca Holloway is a 31 y.o. G1P0 at [redacted]w[redacted]d being seen today for ongoing prenatal care.  She is currently monitored for the following issues for this high-risk pregnancy and has Supervision of high risk pregnancy, antepartum and Pulmonary embolism ([place]) on their problem list. ? ?Patient reports no complaints.  Contractions: Not present. Vag. Bleeding: None.  Movement: Present. Denies leaking of fluid.  ? ?The following portions of the patient's history were reviewed and updated as appropriate: allergies, current medications, past family history, past medical history, past social history, past surgical history and problem list.  ? ?Objective:  ? ?Vitals:  ? 12/13/21 1331  ?BP: 125/82  ?Pulse: (!) 112  ?Weight: 244 lb (110.7 kg)  ? ? ?Fetal Status: Fetal Heart Rate (bpm): 160   Movement: Present    ? ?General:  Alert, oriented and cooperative. Patient is in no acute distress.  ?Skin: Skin is warm and dry. No rash noted.   ?Cardiovascular: Normal heart rate noted  ?Respiratory: Normal respiratory effort, no problems with respiration noted  ?Abdomen: Soft, gravid, appropriate for gestational age.  Pain/Pressure: Present     ?Pelvic: Cervical exam deferred        ?Extremities: Normal range of motion.     ?Mental Status: Normal mood and affect. Normal behavior. Normal judgment and thought content.  ? ?Assessment and Plan:  ?Pregnancy: G1P0 at [redacted]w[redacted]d ?1. Supervision of high risk pregnancy, antepartum ?Patient is doing well without complaints ?Anatomy ultrasound reviewed with the patient- follow-up growth and anatomy scheduled ?AFP today ? ?2. Pulmonary embolism, other, unspecified chronicity, unspecified whether acute cor pulmonale present ([place]) ?Continue lovenox ? ?Preterm labor symptoms and general obstetric precautions including but not limited to vaginal bleeding, contractions, leaking of fluid and fetal movement were reviewed in detail with the patient. ?Please refer to After Visit  Summary for other counseling recommendations.  ? ?Return in about 4 weeks (around 01/10/2022) for in person, ROB, High risk. ? ?Future Appointments  ?Date Time Provider [place]  ?01/11/2022  2:30 PM [facility] NURSE [facility]  ?01/11/2022  2:45 PM [facility] US5 [facility]  ? ? ?Mora Bellman, MD ? ?

## 2021-12-13 NOTE — Progress Notes (Signed)
Pt had u/s this morning. ?Pt states she has form she needs for dental work. ? ?

## 2021-12-16 LAB — AFP, SERUM, OPEN SPINA BIFIDA
AFP MoM: 1.42
AFP Value: 55.1 ng/mL
Gest. Age on Collection Date: 19.1 weeks
Maternal Age At EDD: 30.5 yr
OSBR Risk 1 IN: 3404
Test Results:: NEGATIVE
Weight: 243 [lb_av]

## 2021-12-19 ENCOUNTER — Encounter: Payer: Self-pay | Admitting: Obstetrics and Gynecology

## 2022-01-03 ENCOUNTER — Other Ambulatory Visit: Payer: Self-pay

## 2022-01-03 ENCOUNTER — Emergency Department (HOSPITAL_COMMUNITY)
Admission: EM | Admit: 2022-01-03 | Discharge: 2022-01-03 | Disposition: A | Discharge status: Home / Self Care | Payer: Medicaid Other | Attending: Emergency Medicine | Admitting: Emergency Medicine

## 2022-01-03 DIAGNOSIS — Z3A22 22 weeks gestation of pregnancy: Secondary | ICD-10-CM | POA: Diagnosis not present

## 2022-01-03 DIAGNOSIS — J45909 Unspecified asthma, uncomplicated: Secondary | ICD-10-CM | POA: Diagnosis not present

## 2022-01-03 DIAGNOSIS — M791 Myalgia, unspecified site: Secondary | ICD-10-CM | POA: Diagnosis not present

## 2022-01-03 DIAGNOSIS — O26892 Other specified pregnancy related conditions, second trimester: Secondary | ICD-10-CM | POA: Insufficient documentation

## 2022-01-03 DIAGNOSIS — Z20822 Contact with and (suspected) exposure to covid-19: Secondary | ICD-10-CM | POA: Diagnosis not present

## 2022-01-03 DIAGNOSIS — J040 Acute laryngitis: Secondary | ICD-10-CM | POA: Insufficient documentation

## 2022-01-03 LAB — RESP PANEL BY RT-PCR (FLU A&B, COVID) ARPGX2
Influenza A by PCR: NEGATIVE
Influenza B by PCR: NEGATIVE
SARS Coronavirus 2 by RT PCR: NEGATIVE

## 2022-01-03 NOTE — ED Triage Notes (Signed)
Pt. Stated, I lost my voice this morning, my throat was scratchy for 2 days.

## 2022-01-03 NOTE — ED Notes (Signed)
Pt not present in assessment area to provide discharge instructions.
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
## 2022-01-03 NOTE — Discharge Instructions (Signed)
Return to the ED with any new symptoms such as inability to swallow Please follow-up with your OB/GYN on 6/7 Please read attached informational guide concerning laryngitis Please utilize voice rest, humidifiers as well as warm beverages for comfort at home.  You may also purchase Chloraseptic spray.

## 2022-01-11 ENCOUNTER — Ambulatory Visit: Payer: Medicaid Other | Admitting: *Deleted

## 2022-01-11 ENCOUNTER — Ambulatory Visit: Payer: Medicaid Other

## 2022-01-11 ENCOUNTER — Ambulatory Visit: Payer: Medicaid Other | Attending: Obstetrics and Gynecology | Admitting: Obstetrics and Gynecology

## 2022-01-11 ENCOUNTER — Other Ambulatory Visit: Payer: Self-pay | Admitting: Maternal & Fetal Medicine

## 2022-01-11 ENCOUNTER — Ambulatory Visit: Payer: Medicaid Other | Attending: Maternal & Fetal Medicine

## 2022-01-11 ENCOUNTER — Ambulatory Visit (INDEPENDENT_AMBULATORY_CARE_PROVIDER_SITE_OTHER): Payer: Medicaid Other | Admitting: Obstetrics & Gynecology

## 2022-01-11 VITALS — BP 121/80 | HR 109 | Wt 251.0 lb

## 2022-01-11 VITALS — BP 122/79 | HR 103

## 2022-01-11 DIAGNOSIS — O99212 Obesity complicating pregnancy, second trimester: Secondary | ICD-10-CM | POA: Diagnosis present

## 2022-01-11 DIAGNOSIS — O99012 Anemia complicating pregnancy, second trimester: Secondary | ICD-10-CM | POA: Diagnosis not present

## 2022-01-11 DIAGNOSIS — O0990 Supervision of high risk pregnancy, unspecified, unspecified trimester: Secondary | ICD-10-CM | POA: Insufficient documentation

## 2022-01-11 DIAGNOSIS — I2699 Other pulmonary embolism without acute cor pulmonale: Secondary | ICD-10-CM

## 2022-01-11 DIAGNOSIS — Z3A23 23 weeks gestation of pregnancy: Secondary | ICD-10-CM

## 2022-01-11 DIAGNOSIS — E669 Obesity, unspecified: Secondary | ICD-10-CM

## 2022-01-11 DIAGNOSIS — O358XX0 Maternal care for other (suspected) fetal abnormality and damage, not applicable or unspecified: Secondary | ICD-10-CM

## 2022-01-11 DIAGNOSIS — O283 Abnormal ultrasonic finding on antenatal screening of mother: Secondary | ICD-10-CM | POA: Diagnosis present

## 2022-01-11 DIAGNOSIS — Z86711 Personal history of pulmonary embolism: Secondary | ICD-10-CM

## 2022-01-11 DIAGNOSIS — D573 Sickle-cell trait: Secondary | ICD-10-CM | POA: Diagnosis not present

## 2022-01-11 DIAGNOSIS — O365920 Maternal care for other known or suspected poor fetal growth, second trimester, not applicable or unspecified: Secondary | ICD-10-CM | POA: Diagnosis not present

## 2022-01-11 IMAGING — US US MFM OB FOLLOW-UP
1 series · 12 of 28 positions shown · non-contrast
Comparison: none

[Series 1: us mfm ob follow-up · 57 acquisitions, 12 frames shown]
[im 3/57]
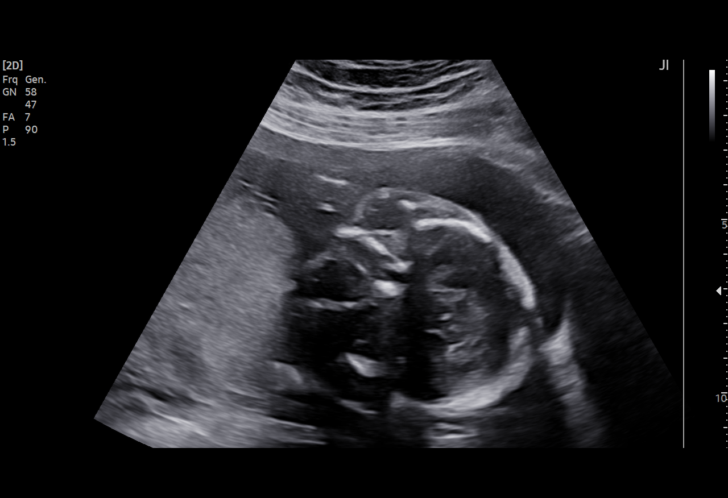
[im 7/57]
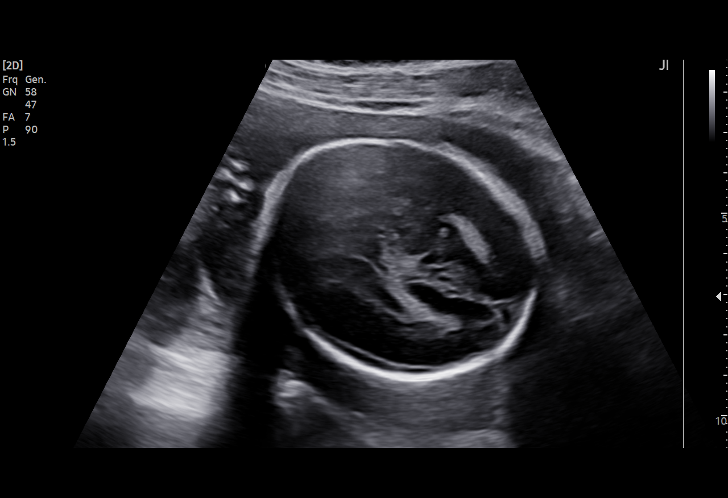
[im 11/57]
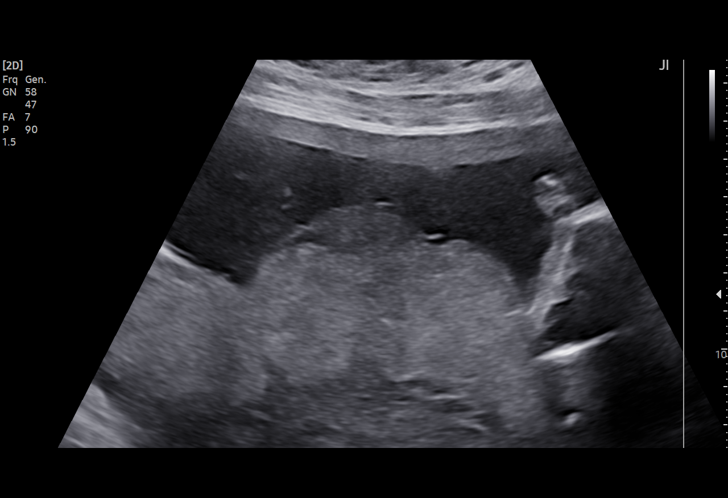
[im 17/57]
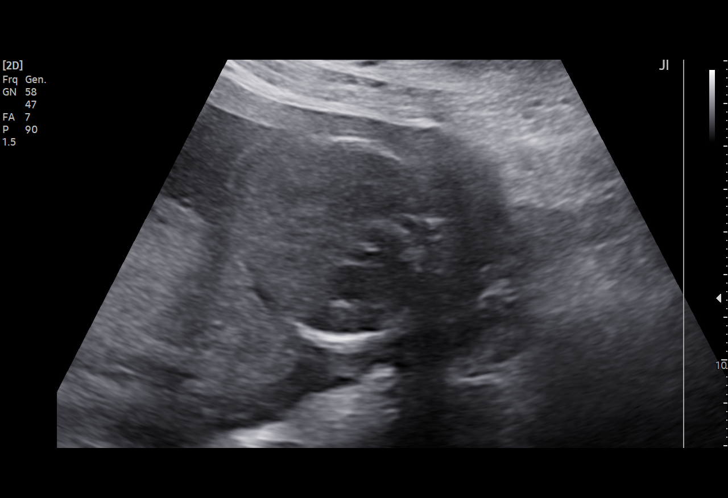
[im 21/57]
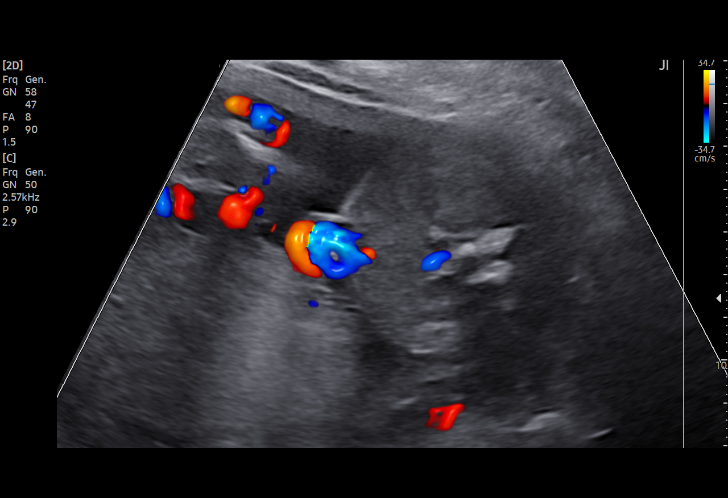
[im 25/57]
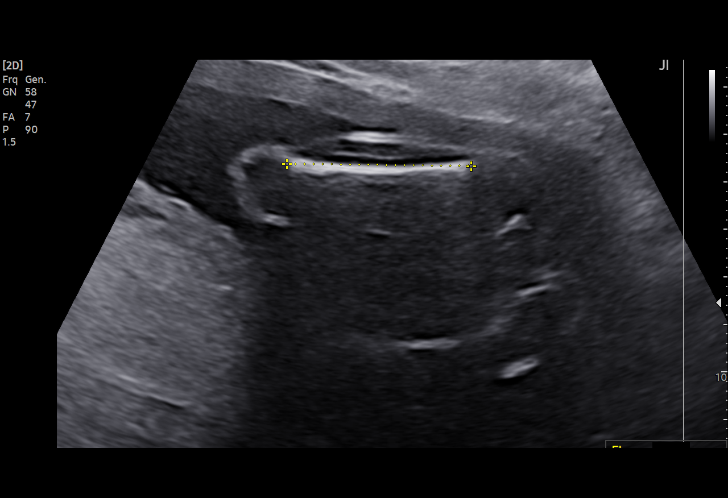
[im 32/57]
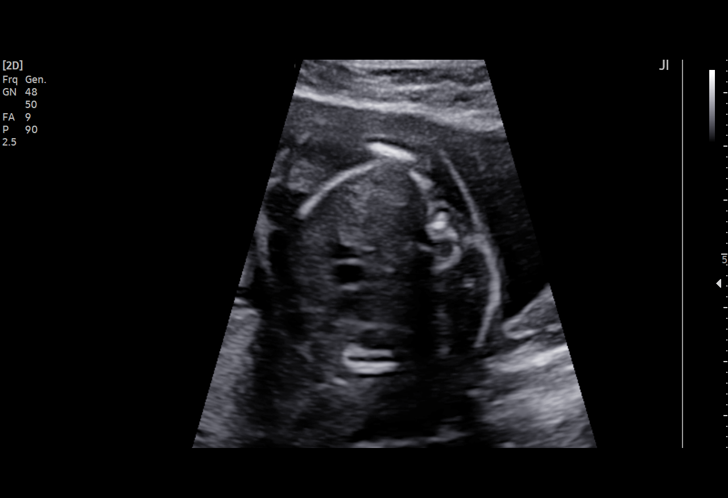
[im 36/57]
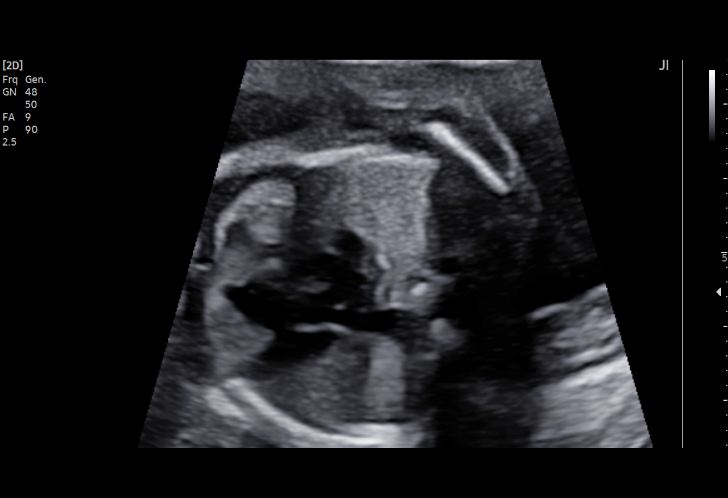
[im 40/57]
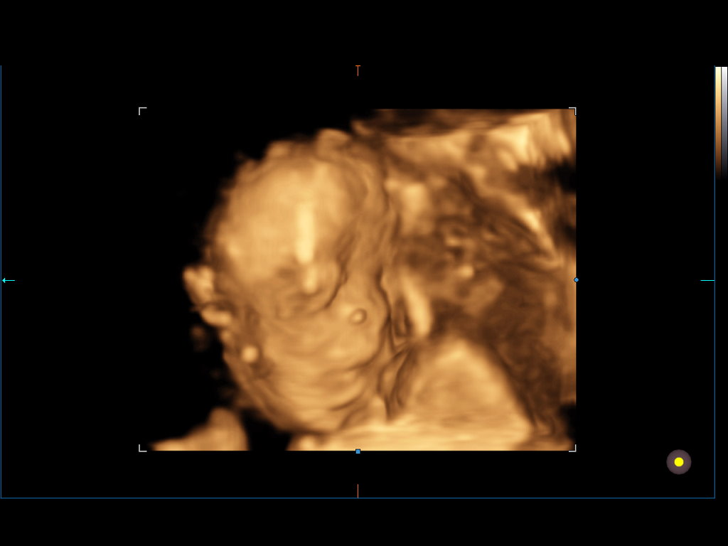
[im 46/57]
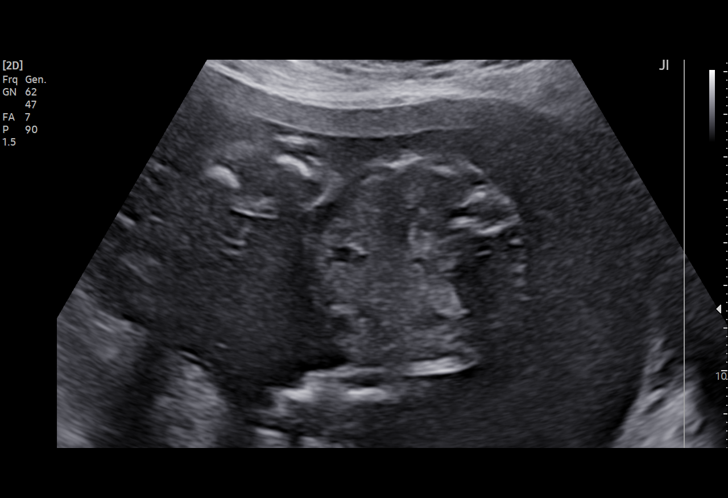
[im 50/57]
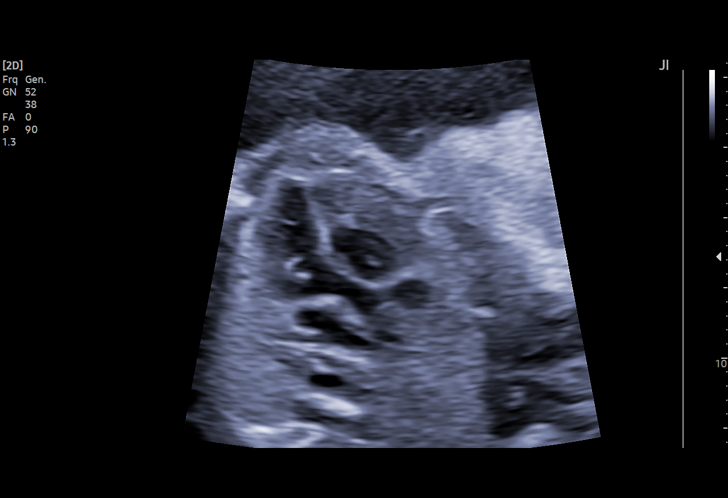
[im 54/57]
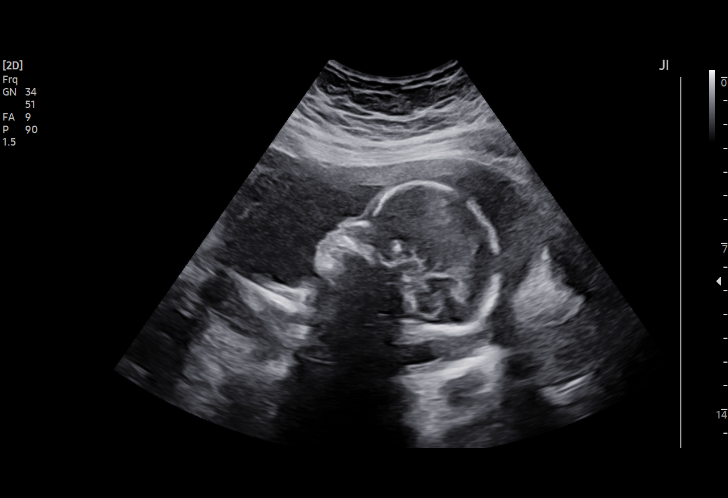

[12 of 28 positions shown; findings below may reference images not displayed]

PIESLIKAS
 2  US MFM UA CORD DOPPLER                76820.02    ADILSON FERNANDO
                                                      PIESLIKAS

Indications

 Obesity complicating pregnancy, second
 trimester (pre-G BMI 44)
 23 weeks gestation of pregnancy
 Genetic carrier (Sickle cell)
 Echogenic intracardiac focus of the heart
 (EIF)
 History of DVT / PE, currently on Lovenox
 LR NIPS - Female
 Antenatal follow-up for nonvisualized fetal
 anatomy
Vital Signs

 BP:          122/79
Fetal Evaluation

 Num Of Fetuses:         1
 Fetal Heart Rate(bpm):  155
 Cardiac Activity:       Observed
 Presentation:           Cephalic
 Placenta:               Posterior

 Amniotic Fluid
 AFI FV:      Within normal limits

                             Largest Pocket(cm)

Biometry

 BPD:     55.88  mm     G. Age:  23w 0d         36  %    CI:        75.29   %    70 - 86
                                                         FL/HC:      19.4   %    19.2 -
 HC:    204.26   mm     G. Age:  22w 4d         11  %    HC/AC:      1.25        1.05 -
 AC:    163.59   mm     G. Age:  21w 3d        3.5  %    FL/BPD:     70.8   %    71 - 87
 FL:      39.59  mm     G. Age:  22w 5d         22  %    FL/AC:      24.2   %    20 - 24

 IOD:      12.2  mm     G. Age:  18w 4d         20  %
 OOD:      35.4  mm     G. Age:  21w 0d         26  %

 Est. FW:     477  gm      1 lb 1 oz      6  %
OB History

 Gravidity:    1         Term:   0        Prem:   0        SAB:   0
 TOP:          0       Ectopic:  0        Living: 0
Gestational Age

 LMP:           23w 2d        Date:  08/01/21                 EDD:   05/08/22
 U/S Today:     22w 3d                                        EDD:   05/14/22
 Best:          23w 2d     Det. By:  LMP  (08/01/21)          EDD:   05/08/22
Anatomy

 Cranium:               Appears normal         LVOT:                   Appears normal
 Cavum:                 Appears normal         Aortic Arch:            Appears normal
 Ventricles:            Appears normal         Ductal Arch:            Previously seen
 Choroid Plexus:        Appears normal         Diaphragm:              Appears normal
 Cerebellum:            Appears normal         Stomach:                Appears normal, left
                                                                       sided
 Posterior Fossa:       Appears normal         Abdomen:                Appears normal
 Nuchal Fold:           Appears normal         Abdominal Wall:         Previously seen
 Face:                  Appears normal         Cord Vessels:           Appears normal (3
                        (orbits and profile)                           vessel cord)
 Lips:                  Appears normal         Kidneys:                Appear normal
 Palate:                Not well visualized    Bladder:                Appears normal
 Thoracic:              Appears normal         Spine:                  Previously seen
 Heart:                 Appears normal; EIF    Upper Extremities:      Previously seen
 RVOT:                  Appears normal         Lower Extremities:      Previously seen

 Other:  Nasal bone, lenses, maxilla, and mandible, visualized. VC, 3VV and
         3VTV visualized. Fetus appears to be female. Heels/feet and open
         hands/5th digits previously visualized.
Doppler - Fetal Vessels

 Umbilical Artery
  S/D     %tile                                              ADFV    RDFV
  2.57        7                                                 No      No

Impression
 On today's ultrasound, the estimated fetal weight is at the 6th
 percentile.  Abdominal circumference measurement is at the
 4th percentile.  Amniotic fluid is normal and good fetal activity
 seen.  Umbilical artery Doppler showed normal forward
 diastolic flow.  Fetal anatomical survey was completed and
 appears normal.
 xxxxxxxxxxxxxxxxxxxxxxxxxxxxxxxxxxxxxxxxxxxxxxxxxxxxxxxxx
 xxxxx
 Consultation (see [REDACTED] )

 I had the pleasure of seeing Ms. Axund Ilevle today at the
 [HOSPITAL].  She returned for completion
 of fetal anatomy.
 Past medical history significant for pulmonary embolism
 following leg surgery.  Patient is on prophylactic Lovenox 40
 mg once daily.
 On cell-free fetal DNA screening, the risks of fetal aneuploidy
 is not increased.  MSAFP screening showed low risk for open
 neural tube defects.

 Fetal growth restriction
 I explained the finding of fetal growth restriction that is difficult
 to differentiate from a constitutionally small for gestational
 age fetus.
 I discussed the possible causes of fetal growth restriction
 including placental insufficiency (most common cause),
 chromosomal anomalies and fetal infections.  Patient did not
 give history of fever or rashes.
 I explained that only amniocentesis will give a definitive result
 on the fetal karyotype and some genetic conditions
 (MicroArray).  I explained amniocentesis procedure and
 possible complication of preterm delivery (1 and 500
 procedures).  Patient opted not to have amniocentesis.
 I discussed ultrasound protocol of monitoring fetal growth
 restriction.  Fetal growth restriction resolves in some cases
 and we will modify the frequency of antenatal testing.

 Timing of delivery: We will discuss timing of delivery with
 advancing gestation.

Recommendations

 -An appointment was made for her to return in 3 weeks for
 fetal growth assessment and UA Doppler studies.
                Weilbach, Fundi

## 2022-01-11 NOTE — Progress Notes (Signed)
Maternal-Fetal Medicine   Name: Rebecca Holloway DOB: 1990-10-30 MRN: [id number] Referring Provider: Scheryl Darter, MD  I had the pleasure of seeing [name] at the [facility].  She returned for completion of fetal anatomy. Past medical history significant for pulmonary embolism following leg surgery.  Patient is on prophylactic Lovenox 40 mg once daily. On cell-free fetal DNA screening, the risks of fetal aneuploidy is not increased.  MSAFP screening showed low risk for open neural tube defects.  Ultrasound On today's ultrasound, the estimated fetal weight is at the 6th percentile.  Abdominal circumference measurement is at the 4th percentile.  Amniotic fluid is normal and good fetal activity seen.  Umbilical artery Doppler showed normal forward diastolic flow.  Fetal anatomical survey was completed and appears normal.  Fetal growth restriction I explained the finding of fetal growth restriction that is difficult to differentiate from a constitutionally small for gestational age fetus. I discussed the possible causes of fetal growth restriction including placental insufficiency (most common cause), chromosomal anomalies and fetal infections.  Patient did not give history of fever or rashes. I explained that only amniocentesis will give a definitive result on the fetal karyotype and some genetic conditions Doctor, general practice).  I explained amniocentesis procedure and possible complication of preterm delivery (1 and 500 procedures).  Patient opted not to have amniocentesis. I discussed ultrasound protocol of monitoring fetal growth restriction.  Fetal growth restriction resolves in some cases and we will modify the frequency of antenatal testing.  Timing of delivery: We will discuss timing of delivery with advancing gestation.  Recommendations -An appointment was made for her to return in 3 weeks for fetal growth assessment and UA Doppler studies.  Thank you for consultation.   If you have any questions or concerns, please contact me the [facility].  Consultation including face-to-face (more than 50%) counseling 20 minutes.

## 2022-01-11 NOTE — Progress Notes (Signed)
   PRENATAL VISIT NOTE  Subjective:  Rebecca Holloway is a 31 y.o. G1P0 at [redacted]w[redacted]d being seen today for ongoing prenatal care.  She is currently monitored for the following issues for this high-risk pregnancy and has Supervision of high risk pregnancy, antepartum and Pulmonary embolism ([place]) on their problem list.  Patient reports no complaints.  Contractions: Irritability. Vag. Bleeding: None.  Movement: Present. Denies leaking of fluid.   The following portions of the patient's history were reviewed and updated as appropriate: allergies, current medications, past family history, past medical history, past social history, past surgical history and problem list.   Objective:   Vitals:   01/11/22 1336  BP: 121/80  Pulse: (!) 109  Weight: 251 lb (113.9 kg)    Fetal Status: Fetal Heart Rate (bpm): 160   Movement: Present     General:  Alert, oriented and cooperative. Patient is in no acute distress.  Skin: Skin is warm and dry. No rash noted.   Cardiovascular: Normal heart rate noted  Respiratory: Normal respiratory effort, no problems with respiration noted  Abdomen: Soft, gravid, appropriate for gestational age.  Pain/Pressure: Present     Pelvic: Cervical exam deferred        Extremities: Normal range of motion.  Edema: None  Mental Status: Normal mood and affect. Normal behavior. Normal judgment and thought content.   Assessment and Plan:  Pregnancy: G1P0 at [redacted]w[redacted]d 1. Supervision of high risk pregnancy, antepartum F/u US today  2. Pulmonary embolism, other, unspecified chronicity, unspecified whether acute cor pulmonale present (HCC) Lovenox  Preterm labor symptoms and general obstetric precautions including but not limited to vaginal bleeding, contractions, leaking of fluid and fetal movement were reviewed in detail with the patient. Please refer to After Visit Summary for other counseling recommendations.   Return in about 4 weeks (around 02/08/2022).  Future Appointments  Date  Time Provider Sarita  01/11/2022  2:30 PM [facility] NURSE [facility]  01/11/2022  2:45 PM [facility] US5 [facility] [place]    Emeterio Reeve, MD

## 2022-01-12 ENCOUNTER — Other Ambulatory Visit: Payer: Self-pay | Admitting: *Deleted

## 2022-01-12 DIAGNOSIS — O365921 Maternal care for other known or suspected poor fetal growth, second trimester, fetus 1: Secondary | ICD-10-CM

## 2022-01-12 DIAGNOSIS — O283 Abnormal ultrasonic finding on antenatal screening of mother: Secondary | ICD-10-CM

## 2022-01-12 DIAGNOSIS — O99212 Obesity complicating pregnancy, second trimester: Secondary | ICD-10-CM

## 2022-01-12 DIAGNOSIS — O88212 Thromboembolism in pregnancy, second trimester: Secondary | ICD-10-CM

## 2022-01-12 NOTE — Progress Notes (Unsigned)
Us/

## 2022-02-01 ENCOUNTER — Ambulatory Visit: Payer: Medicaid Other

## 2022-02-08 ENCOUNTER — Encounter: Payer: Self-pay | Admitting: Obstetrics and Gynecology

## 2022-02-08 ENCOUNTER — Other Ambulatory Visit: Payer: Medicaid Other

## 2022-02-08 ENCOUNTER — Ambulatory Visit (INDEPENDENT_AMBULATORY_CARE_PROVIDER_SITE_OTHER): Payer: Medicaid Other | Admitting: Obstetrics and Gynecology

## 2022-02-08 VITALS — BP 124/77 | HR 96 | Wt 261.9 lb

## 2022-02-08 DIAGNOSIS — Z86711 Personal history of pulmonary embolism: Secondary | ICD-10-CM | POA: Insufficient documentation

## 2022-02-08 DIAGNOSIS — Z3A27 27 weeks gestation of pregnancy: Secondary | ICD-10-CM

## 2022-02-08 DIAGNOSIS — O0990 Supervision of high risk pregnancy, unspecified, unspecified trimester: Secondary | ICD-10-CM

## 2022-02-08 DIAGNOSIS — Z6841 Body Mass Index (BMI) 40.0 and over, adult: Secondary | ICD-10-CM

## 2022-02-08 NOTE — Addendum Note (Signed)
Addended by: Jearld Adjutant on: 02/08/2022 09:46 AM   Modules accepted: Orders

## 2022-02-08 NOTE — Progress Notes (Signed)
   PRENATAL VISIT NOTE  Subjective:  Rebecca Holloway is a 31 y.o. G1P0 at [redacted]w[redacted]d being seen today for ongoing prenatal care.  She is currently monitored for the following issues for this high-risk pregnancy and has Supervision of high risk pregnancy, antepartum; Pulmonary embolism (HCC); History of pulmonary embolus (PE); and BMI 50.0-59.9, adult (HCC) on their problem list.  Patient doing well with no acute concerns today. She reports no complaints.  Contractions: Not present. Vag. Bleeding: None.  Movement: Present. Denies leaking of fluid.   The following portions of the patient's history were reviewed and updated as appropriate: allergies, current medications, past family history, past medical history, past social history, past surgical history and problem list. Problem list updated.  Objective:   Vitals:   02/08/22 0835  BP: 124/77  Pulse: 96  Weight: 261 lb 14.4 oz (118.8 kg)    Fetal Status: Fetal Heart Rate (bpm): 141 Fundal Height: 28 cm Movement: Present     General:  Alert, oriented and cooperative. Patient is in no acute distress.  Skin: Skin is warm and dry. No rash noted.   Cardiovascular: Normal heart rate noted  Respiratory: Normal respiratory effort, no problems with respiration noted  Abdomen: Soft, gravid, appropriate for gestational age.  Pain/Pressure: Present     Pelvic: Cervical exam deferred        Extremities: Normal range of motion.  Edema: Trace  Mental Status:  Normal mood and affect. Normal behavior. Normal judgment and thought content.   Assessment and Plan:  Pregnancy: G1P0 at [redacted]w[redacted]d  1. Supervision of high risk pregnancy, antepartum Continue routine prenatal care, 2 hour GTT today U/S in 1 week to check fetal growth, previously SGA, potentially FGR  2. [redacted] weeks gestation of pregnancy   3. History of pulmonary embolus (PE) Pt notes compliance with lovenox, will continue  4. BMI 50.0-59.9, adult (HCC) Monitor weight gain  Preterm labor symptoms and  general obstetric precautions including but not limited to vaginal bleeding, contractions, leaking of fluid and fetal movement were reviewed in detail with the patient.  Please refer to After Visit Summary for other counseling recommendations.   Return in about 2 weeks (around 02/22/2022) for [facility], in person.   Mariel Aloe, MD Faculty Attending Center for [facility]

## 2022-02-08 NOTE — Progress Notes (Signed)
Pt presents for ROB visit. No concerns at this time.  

## 2022-02-08 NOTE — Addendum Note (Signed)
Addended by: Jearld Adjutant on: 02/08/2022 09:47 AM   Modules accepted: Orders

## 2022-02-09 ENCOUNTER — Other Ambulatory Visit: Payer: Self-pay | Admitting: *Deleted

## 2022-02-09 DIAGNOSIS — O24419 Gestational diabetes mellitus in pregnancy, unspecified control: Secondary | ICD-10-CM

## 2022-02-09 LAB — CBC
Hematocrit: 33.5 % — ABNORMAL LOW (ref 34.0–46.6)
Hemoglobin: 11.3 g/dL (ref 11.1–15.9)
MCH: 29.2 pg (ref 26.6–33.0)
MCHC: 33.7 g/dL (ref 31.5–35.7)
MCV: 87 fL (ref 79–97)
Platelets: 237 10*3/uL (ref 150–450)
RBC: 3.87 x10E6/uL (ref 3.77–5.28)
RDW: 13.5 % (ref 11.7–15.4)
WBC: 8.8 10*3/uL (ref 3.4–10.8)

## 2022-02-09 LAB — GLUCOSE TOLERANCE, 2 HOURS W/ 1HR
Glucose, 1 hour: 218 mg/dL — ABNORMAL HIGH (ref 70–179)
Glucose, 2 hour: 151 mg/dL (ref 70–152)
Glucose, Fasting: 110 mg/dL — ABNORMAL HIGH (ref 70–91)

## 2022-02-09 LAB — HIV ANTIBODY (ROUTINE TESTING W REFLEX): HIV Screen 4th Generation wRfx: NONREACTIVE

## 2022-02-09 LAB — RPR: RPR Ser Ql: NONREACTIVE

## 2022-02-09 MED ORDER — ACCU-CHEK GUIDE W/DEVICE KIT: PACK | 0 refills | Status: DC

## 2022-02-09 MED ORDER — ACCU-CHEK SOFTCLIX LANCETS MISC: 1.0000 | Freq: Four times a day (QID) | 6 refills | Status: DC

## 2022-02-09 MED ORDER — ACCU-CHEK GUIDE VI STRP: 1.0000 | ORAL_STRIP | Freq: Four times a day (QID) | 6 refills | Status: DC

## 2022-02-09 NOTE — Progress Notes (Signed)
N&D referral entered today for recent GDM Dx. Diabetic testing supplies ordered today.

## 2022-02-13 ENCOUNTER — Ambulatory Visit: Payer: Medicaid Other | Admitting: *Deleted

## 2022-02-13 ENCOUNTER — Encounter: Payer: Self-pay | Admitting: *Deleted

## 2022-02-13 ENCOUNTER — Ambulatory Visit: Payer: Medicaid Other | Attending: Obstetrics and Gynecology

## 2022-02-13 VITALS — BP 132/74 | HR 102

## 2022-02-13 DIAGNOSIS — O365921 Maternal care for other known or suspected poor fetal growth, second trimester, fetus 1: Secondary | ICD-10-CM | POA: Insufficient documentation

## 2022-02-13 DIAGNOSIS — E669 Obesity, unspecified: Secondary | ICD-10-CM | POA: Diagnosis not present

## 2022-02-13 DIAGNOSIS — Z3A28 28 weeks gestation of pregnancy: Secondary | ICD-10-CM

## 2022-02-13 DIAGNOSIS — O358XX0 Maternal care for other (suspected) fetal abnormality and damage, not applicable or unspecified: Secondary | ICD-10-CM

## 2022-02-13 DIAGNOSIS — O24410 Gestational diabetes mellitus in pregnancy, diet controlled: Secondary | ICD-10-CM

## 2022-02-13 DIAGNOSIS — O283 Abnormal ultrasonic finding on antenatal screening of mother: Secondary | ICD-10-CM | POA: Diagnosis not present

## 2022-02-13 DIAGNOSIS — O0990 Supervision of high risk pregnancy, unspecified, unspecified trimester: Secondary | ICD-10-CM | POA: Insufficient documentation

## 2022-02-13 DIAGNOSIS — O88212 Thromboembolism in pregnancy, second trimester: Secondary | ICD-10-CM | POA: Diagnosis present

## 2022-02-13 DIAGNOSIS — O99212 Obesity complicating pregnancy, second trimester: Secondary | ICD-10-CM | POA: Diagnosis not present

## 2022-02-14 ENCOUNTER — Other Ambulatory Visit: Payer: Self-pay | Admitting: *Deleted

## 2022-02-14 DIAGNOSIS — O24410 Gestational diabetes mellitus in pregnancy, diet controlled: Secondary | ICD-10-CM

## 2022-02-14 DIAGNOSIS — Z6841 Body Mass Index (BMI) 40.0 and over, adult: Secondary | ICD-10-CM

## 2022-02-14 DIAGNOSIS — O365930 Maternal care for other known or suspected poor fetal growth, third trimester, not applicable or unspecified: Secondary | ICD-10-CM

## 2022-02-15 ENCOUNTER — Encounter: Payer: Medicaid Other | Attending: Obstetrics and Gynecology | Admitting: Registered"

## 2022-02-15 ENCOUNTER — Encounter: Payer: Self-pay | Admitting: Registered"

## 2022-02-15 DIAGNOSIS — Z713 Dietary counseling and surveillance: Secondary | ICD-10-CM | POA: Insufficient documentation

## 2022-02-15 DIAGNOSIS — O24419 Gestational diabetes mellitus in pregnancy, unspecified control: Secondary | ICD-10-CM | POA: Insufficient documentation

## 2022-02-15 DIAGNOSIS — Z3A00 Weeks of gestation of pregnancy not specified: Secondary | ICD-10-CM | POA: Insufficient documentation

## 2022-02-15 NOTE — Progress Notes (Signed)
Patient was seen on 02/15/22 for Gestational Diabetes self-management class at [facility]. The following learning objectives were met by the patient during this course:  States the definition of Gestational Diabetes States why dietary management is important in controlling blood glucose Describes the effects each nutrient has on blood glucose levels Demonstrates ability to create a balanced meal plan Demonstrates carbohydrate counting  States when to check blood glucose levels Demonstrates proper blood glucose monitoring techniques States the effect of stress and exercise on blood glucose levels States the importance of limiting caffeine and abstaining from alcohol and smoking  Blood glucose monitor given: None. Patient has meter to class and checking as directed by MD CBG: 85  Patient instructed to monitor glucose levels: FBS: 60 - <95; 1 hour: <140; 2 hour: <120  Patient received handouts: Nutrition Diabetes and Pregnancy, including carb counting list  Patient states she is drinking Ensure Plus for breakfast option due to needing more protein in diet. Provided Pt with sample and coupon for Ensure Max (30 g protein) Lot# [id number] Exp: [id number]  Patient will be seen for follow-up as needed.

## 2022-02-28 ENCOUNTER — Encounter: Payer: Self-pay | Admitting: *Deleted

## 2022-02-28 ENCOUNTER — Ambulatory Visit (INDEPENDENT_AMBULATORY_CARE_PROVIDER_SITE_OTHER): Payer: Medicaid Other | Admitting: Family Medicine

## 2022-02-28 ENCOUNTER — Ambulatory Visit: Payer: Medicaid Other | Admitting: *Deleted

## 2022-02-28 ENCOUNTER — Ambulatory Visit (HOSPITAL_BASED_OUTPATIENT_CLINIC_OR_DEPARTMENT_OTHER): Payer: Medicaid Other | Admitting: *Deleted

## 2022-02-28 ENCOUNTER — Ambulatory Visit: Payer: Medicaid Other | Attending: Obstetrics and Gynecology

## 2022-02-28 VITALS — BP 124/78 | HR 97 | Wt 266.6 lb

## 2022-02-28 VITALS — BP 109/62 | HR 101

## 2022-02-28 DIAGNOSIS — O24419 Gestational diabetes mellitus in pregnancy, unspecified control: Secondary | ICD-10-CM

## 2022-02-28 DIAGNOSIS — Z6841 Body Mass Index (BMI) 40.0 and over, adult: Secondary | ICD-10-CM | POA: Diagnosis present

## 2022-02-28 DIAGNOSIS — O358XX0 Maternal care for other (suspected) fetal abnormality and damage, not applicable or unspecified: Secondary | ICD-10-CM

## 2022-02-28 DIAGNOSIS — E669 Obesity, unspecified: Secondary | ICD-10-CM | POA: Diagnosis not present

## 2022-02-28 DIAGNOSIS — O99213 Obesity complicating pregnancy, third trimester: Secondary | ICD-10-CM

## 2022-02-28 DIAGNOSIS — Z3A30 30 weeks gestation of pregnancy: Secondary | ICD-10-CM | POA: Insufficient documentation

## 2022-02-28 DIAGNOSIS — O0990 Supervision of high risk pregnancy, unspecified, unspecified trimester: Secondary | ICD-10-CM

## 2022-02-28 DIAGNOSIS — D573 Sickle-cell trait: Secondary | ICD-10-CM

## 2022-02-28 DIAGNOSIS — O24410 Gestational diabetes mellitus in pregnancy, diet controlled: Secondary | ICD-10-CM | POA: Diagnosis not present

## 2022-02-28 DIAGNOSIS — O365930 Maternal care for other known or suspected poor fetal growth, third trimester, not applicable or unspecified: Secondary | ICD-10-CM

## 2022-02-28 DIAGNOSIS — Z86711 Personal history of pulmonary embolism: Secondary | ICD-10-CM

## 2022-02-28 DIAGNOSIS — O99113 Other diseases of the blood and blood-forming organs and certain disorders involving the immune mechanism complicating pregnancy, third trimester: Secondary | ICD-10-CM

## 2022-02-28 DIAGNOSIS — G5601 Carpal tunnel syndrome, right upper limb: Secondary | ICD-10-CM

## 2022-02-28 DIAGNOSIS — G5603 Carpal tunnel syndrome, bilateral upper limbs: Secondary | ICD-10-CM | POA: Insufficient documentation

## 2022-02-28 NOTE — Procedures (Signed)
Rebecca Holloway 1991-04-19 [redacted]w[redacted]d  Fetus A Non-Stress Test Interpretation for 02/28/22  Indication: IUGR  Fetal Heart Rate A Baseline Rate (A): 145 bpm Variability: Moderate Accelerations: 10 x 10 Decelerations: None Multiple birth?: No  Uterine Activity Mode: Palpation, Toco Contraction Frequency (min): None  Interpretation (Fetal Testing) Nonstress Test Interpretation: Reactive Overall Impression: Reassuring for gestational age Comments: Dr. Parke Poisson reviewed tracing.

## 2022-02-28 NOTE — Progress Notes (Signed)
   PRENATAL VISIT NOTE  Subjective:  Rebecca Holloway is a 31 y.o. G1P0 at [redacted]w[redacted]d being seen today for ongoing prenatal care.  She is currently monitored for the following issues for this high-risk pregnancy and has Supervision of high risk pregnancy, antepartum; Pulmonary embolism (HCC); History of pulmonary embolus (PE); BMI 50.0-59.9, adult (HCC); Gestational diabetes mellitus (GDM), antepartum; and Carpal tunnel syndrome of right wrist on their problem list.  Patient reports carpal tunnel symptoms in right hand that started a couple of weeks ago. Gets numbness and tingling in right hand, specifically the first 3 digits.  Contractions: Not present. Vag. Bleeding: None.  Movement: Present. Denies leaking of fluid.   The following portions of the patient's history were reviewed and updated as appropriate: allergies, current medications, past family history, past medical history, past social history, past surgical history and problem list.   Objective:   Vitals:   02/28/22 1447  BP: 124/78  Pulse: 97  Weight: 266 lb 9.6 oz (120.9 kg)    Fetal Status: Fetal Heart Rate (bpm): 145   Movement: Present     General:  Alert, oriented and cooperative. Patient is in no acute distress.  Skin: Skin is warm and dry. No rash noted.   Cardiovascular: Normal heart rate noted  Respiratory: Normal respiratory effort, no problems with respiration noted  Abdomen: Soft, gravid, appropriate for gestational age.  Pain/Pressure: Present     Pelvic: Cervical exam deferred        Extremities: Normal range of motion.  Edema: Trace  Mental Status: Normal mood and affect. Normal behavior. Normal judgment and thought content.   Assessment and Plan:  Pregnancy: G1P0 at [redacted]w[redacted]d 1. Supervision of high risk pregnancy, antepartum FHT and FH normal  2. BMI 50.0-59.9, adult (HCC)  3. History of pulmonary embolus (PE) On lovenox  4. Gestational diabetes mellitus (GDM), antepartum, gestational diabetes method of control  unspecified Fastings are elevated. Not eating bedtime snack.  PP are within range Will have her start eating bedtime snack. If this doesn't improve, then will start medication.  5. Carpal tunnel syndrome of right wrist Referral to sports medicine for possible saline injection. Would prefer to avoid steroids if possible.  Preterm labor symptoms and general obstetric precautions including but not limited to vaginal bleeding, contractions, leaking of fluid and fetal movement were reviewed in detail with the patient. Please refer to After Visit Summary for other counseling recommendations.   No follow-ups on file.  Future Appointments  Date Time Provider Department Center  03/07/2022  8:30 AM [facility] NURSE [facility]  03/07/2022  8:45 AM [facility] NST [facility]  03/07/2022  9:30 AM [facility] US2 [facility]  03/14/2022  9:30 AM [facility] NURSE [facility]  03/14/2022  9:45 AM [facility] NST [facility]  03/28/2022  1:50 PM Constant, Gigi Gin, MD [facility] None  04/11/2022  2:30 PM Adam Phenix, MD [facility] None  04/17/2022  3:30 PM Warden Fillers, MD [facility] None  04/24/2022  3:50 PM Constant, Gigi Gin, MD [facility] None  05/01/2022  3:50 PM Constant, Gigi Gin, MD [facility] None  05/08/2022  2:30 PM Constant, Gigi Gin, MD [facility] None    Levie Heritage, DO

## 2022-02-28 NOTE — Progress Notes (Signed)
Pt reports good fetal movement with some pressure. Pt reports fasting BG this morning was 95 but did not eat and BG increased to 147 by 11am.

## 2022-03-07 ENCOUNTER — Other Ambulatory Visit: Payer: Self-pay | Admitting: *Deleted

## 2022-03-07 ENCOUNTER — Ambulatory Visit (HOSPITAL_BASED_OUTPATIENT_CLINIC_OR_DEPARTMENT_OTHER): Payer: Medicaid Other | Admitting: *Deleted

## 2022-03-07 ENCOUNTER — Ambulatory Visit: Payer: Medicaid Other | Admitting: *Deleted

## 2022-03-07 ENCOUNTER — Ambulatory Visit: Payer: Medicaid Other | Attending: Obstetrics and Gynecology

## 2022-03-07 ENCOUNTER — Encounter: Payer: Self-pay | Admitting: *Deleted

## 2022-03-07 VITALS — BP 118/65 | HR 104

## 2022-03-07 DIAGNOSIS — O285 Abnormal chromosomal and genetic finding on antenatal screening of mother: Secondary | ICD-10-CM

## 2022-03-07 DIAGNOSIS — E669 Obesity, unspecified: Secondary | ICD-10-CM

## 2022-03-07 DIAGNOSIS — O99213 Obesity complicating pregnancy, third trimester: Secondary | ICD-10-CM

## 2022-03-07 DIAGNOSIS — Z86711 Personal history of pulmonary embolism: Secondary | ICD-10-CM

## 2022-03-07 DIAGNOSIS — O24410 Gestational diabetes mellitus in pregnancy, diet controlled: Secondary | ICD-10-CM

## 2022-03-07 DIAGNOSIS — Z86718 Personal history of other venous thrombosis and embolism: Secondary | ICD-10-CM

## 2022-03-07 DIAGNOSIS — Z3A31 31 weeks gestation of pregnancy: Secondary | ICD-10-CM | POA: Insufficient documentation

## 2022-03-07 DIAGNOSIS — O365930 Maternal care for other known or suspected poor fetal growth, third trimester, not applicable or unspecified: Secondary | ICD-10-CM

## 2022-03-07 DIAGNOSIS — O283 Abnormal ultrasonic finding on antenatal screening of mother: Secondary | ICD-10-CM

## 2022-03-07 DIAGNOSIS — O0990 Supervision of high risk pregnancy, unspecified, unspecified trimester: Secondary | ICD-10-CM | POA: Insufficient documentation

## 2022-03-07 DIAGNOSIS — D573 Sickle-cell trait: Secondary | ICD-10-CM

## 2022-03-07 DIAGNOSIS — D5700 Hb-SS disease with crisis, unspecified: Secondary | ICD-10-CM

## 2022-03-07 DIAGNOSIS — O35BXX0 Maternal care for other (suspected) fetal abnormality and damage, fetal cardiac anomalies, not applicable or unspecified: Secondary | ICD-10-CM | POA: Diagnosis not present

## 2022-03-07 DIAGNOSIS — Z6841 Body Mass Index (BMI) 40.0 and over, adult: Secondary | ICD-10-CM

## 2022-03-07 DIAGNOSIS — O99891 Other specified diseases and conditions complicating pregnancy: Secondary | ICD-10-CM

## 2022-03-07 NOTE — Procedures (Signed)
Rebecca Holloway [birth year] [redacted]w[redacted]d  Fetus A Non-Stress Test Interpretation for 03/07/22  Indication: Diabetes  Fetal Heart Rate A Mode: External Baseline Rate (A): 150 bpm Variability: Moderate Accelerations: 10 x 10 Decelerations: None Multiple birth?: No  Uterine Activity Mode: Toco Contraction Frequency (min): none Resting Tone Palpated: Relaxed  Interpretation (Fetal Testing) Nonstress Test Interpretation: Reactive Overall Impression: Reassuring for gestational age Comments: tracing reviewed by Dr. Parke Poisson

## 2022-03-13 ENCOUNTER — Ambulatory Visit: Payer: Medicaid Other

## 2022-03-14 ENCOUNTER — Ambulatory Visit: Payer: Medicaid Other

## 2022-03-14 ENCOUNTER — Ambulatory Visit: Payer: Self-pay

## 2022-03-14 ENCOUNTER — Other Ambulatory Visit: Payer: Self-pay

## 2022-03-14 ENCOUNTER — Encounter: Payer: Self-pay | Admitting: Obstetrics and Gynecology

## 2022-03-14 ENCOUNTER — Ambulatory Visit (INDEPENDENT_AMBULATORY_CARE_PROVIDER_SITE_OTHER): Payer: Medicaid Other | Admitting: Obstetrics and Gynecology

## 2022-03-14 ENCOUNTER — Encounter: Payer: Self-pay | Admitting: *Deleted

## 2022-03-14 ENCOUNTER — Ambulatory Visit: Payer: Medicaid Other | Attending: Obstetrics | Admitting: *Deleted

## 2022-03-14 ENCOUNTER — Ambulatory Visit (HOSPITAL_BASED_OUTPATIENT_CLINIC_OR_DEPARTMENT_OTHER): Payer: Medicaid Other

## 2022-03-14 VITALS — BP 138/80 | HR 111 | Wt 271.0 lb

## 2022-03-14 VITALS — BP 129/71 | HR 96

## 2022-03-14 DIAGNOSIS — O24410 Gestational diabetes mellitus in pregnancy, diet controlled: Secondary | ICD-10-CM | POA: Diagnosis not present

## 2022-03-14 DIAGNOSIS — Z86718 Personal history of other venous thrombosis and embolism: Secondary | ICD-10-CM | POA: Diagnosis not present

## 2022-03-14 DIAGNOSIS — Z86711 Personal history of pulmonary embolism: Secondary | ICD-10-CM | POA: Diagnosis not present

## 2022-03-14 DIAGNOSIS — D5700 Hb-SS disease with crisis, unspecified: Secondary | ICD-10-CM

## 2022-03-14 DIAGNOSIS — O365990 Maternal care for other known or suspected poor fetal growth, unspecified trimester, not applicable or unspecified: Secondary | ICD-10-CM | POA: Insufficient documentation

## 2022-03-14 DIAGNOSIS — O283 Abnormal ultrasonic finding on antenatal screening of mother: Secondary | ICD-10-CM | POA: Diagnosis not present

## 2022-03-14 DIAGNOSIS — O365930 Maternal care for other known or suspected poor fetal growth, third trimester, not applicable or unspecified: Secondary | ICD-10-CM

## 2022-03-14 DIAGNOSIS — Z6841 Body Mass Index (BMI) 40.0 and over, adult: Secondary | ICD-10-CM | POA: Insufficient documentation

## 2022-03-14 DIAGNOSIS — Z3A32 32 weeks gestation of pregnancy: Secondary | ICD-10-CM

## 2022-03-14 DIAGNOSIS — O0990 Supervision of high risk pregnancy, unspecified, unspecified trimester: Secondary | ICD-10-CM

## 2022-03-14 DIAGNOSIS — O0993 Supervision of high risk pregnancy, unspecified, third trimester: Secondary | ICD-10-CM

## 2022-03-14 MED ORDER — METFORMIN HCL 500 MG PO TABS: 500.0000 mg | ORAL_TABLET | Freq: Every day | ORAL | 5 refills | Status: DC

## 2022-03-14 NOTE — Patient Instructions (Signed)

## 2022-03-14 NOTE — Progress Notes (Signed)
Subjective:  Rebecca Holloway is a 31 y.o. G1P0 at [redacted]w[redacted]d being seen today for ongoing prenatal care.  She is currently monitored for the following issues for this high-risk pregnancy and has Supervision of high risk pregnancy, antepartum; Pulmonary embolism (HCC); History of pulmonary embolus (PE); BMI 50.0-59.9, adult (HCC); Gestational diabetes mellitus (GDM), antepartum; Carpal tunnel syndrome of right wrist; and IUGR (intrauterine growth restriction) affecting care of mother on their problem list.  Patient reports general discomforts of pregnancy.  Contractions: Irritability. Vag. Bleeding: None.  Movement: Present. Denies leaking of fluid.   The following portions of the patient's history were reviewed and updated as appropriate: allergies, current medications, past family history, past medical history, past social history, past surgical history and problem list. Problem list updated.  Objective:   Vitals:   03/14/22 1436  BP: 138/80  Pulse: (!) 111  Weight: 122.9 kg    Fetal Status: Fetal Heart Rate (bpm): 150   Movement: Present     General:  Alert, oriented and cooperative. Patient is in no acute distress.  Skin: Skin is warm and dry. No rash noted.   Cardiovascular: Normal heart rate noted  Respiratory: Normal respiratory effort, no problems with respiration noted  Abdomen: Soft, gravid, appropriate for gestational age. Pain/Pressure: Present     Pelvic:  Cervical exam deferred        Extremities: Normal range of motion.  Edema: Trace  Mental Status: Normal mood and affect. Normal behavior. Normal judgment and thought content.   Urinalysis:      Assessment and Plan:  Pregnancy: G1P0 at [redacted]w[redacted]d  1. Supervision of high risk pregnancy, antepartum Stable  2. History of pulmonary embolus (PE) Stable Continue with Lovenox  3. Diet controlled gestational diabetes mellitus (GDM), antepartum CBG's remain elevated, mostly fasting Will start Metformin with evening meal  4. Poor fetal  growth affecting management of mother in third trimester, single or unspecified fetus Continue with serial growth and weekly antenatal test as per MFM  Preterm labor symptoms and general obstetric precautions including but not limited to vaginal bleeding, contractions, leaking of fluid and fetal movement were reviewed in detail with the patient. Please refer to After Visit Summary for other counseling recommendations.  Return in about 2 weeks (around 03/28/2022) for OB visit, face to face, MD only.   Hermina Staggers, MD

## 2022-03-15 ENCOUNTER — Ambulatory Visit: Payer: Self-pay

## 2022-03-15 ENCOUNTER — Ambulatory Visit (INDEPENDENT_AMBULATORY_CARE_PROVIDER_SITE_OTHER): Payer: Medicaid Other | Admitting: Family Medicine

## 2022-03-15 VITALS — BP 137/75 | Ht 59.0 in | Wt 271.0 lb

## 2022-03-15 DIAGNOSIS — M25531 Pain in right wrist: Secondary | ICD-10-CM | POA: Diagnosis not present

## 2022-03-15 DIAGNOSIS — M25532 Pain in left wrist: Secondary | ICD-10-CM | POA: Diagnosis not present

## 2022-03-15 DIAGNOSIS — G5603 Carpal tunnel syndrome, bilateral upper limbs: Secondary | ICD-10-CM

## 2022-03-15 NOTE — Progress Notes (Signed)
   Established Patient Office Visit  Subjective   Patient ID: Rebecca Holloway, female    DOB: [birth year]  Age: 31 y.o. MRN: [id number]  Bilateral wrist pain  Rebecca Holloway is a 31 year old female [redacted] weeks pregnant, high risk, complicated by IUGR, history of pulmonary embolism, obesity and gestational diabetes, who presents today with chief complaint of bilateral wrist pain. She states her wrist pain began about 2 to 3 months ago but over the past week has gotten worse.  She has been trying ice, heat and Tylenol for her pain with minimal to no relief.  Her pain, numbness and tingling is located mainly on the thumb side of her wrist and into her fingers, producing some weakness.  The pain is waking her up in the middle of the night.  She is experiencing more severe pain in her right hand than her left.  Of note she is right-handed.  She denies any injury to that area, bruising, or history of carpal tunnel in the past.  She was referred here by her OB/GYN provider.  ROS: As listed above in HPI   Objective:     BP 137/75   Ht 4\' 11"  (1.499 m)   Wt 271 lb (122.9 kg)   LMP 08/01/2021 (Approximate)   BMI 54.74 kg/m   Physical Exam Vitals reviewed.  Constitutional:      General: She is not in acute distress.    Appearance: Normal appearance. She is obese. She is not ill-appearing, toxic-appearing or diaphoretic.  Pulmonary:     Effort: Pulmonary effort is normal.  Neurological:     Mental Status: She is alert.   Bilateral wrist: No obvious deformity or asymmetry.  No ecchymosis or erythema.  Full range of motion flexion extension bilateral wrist.  Strength 5/5 flexion extension bilateral wrist. Grip strength 4/5 bilateral, secondary to pain. Right wrist: No tenderness to palpation.  Left wrist: tenderness to palpation over distal radius.   Positive Tinel's bilaterally.  Exacerbation of symptoms with Phalen's test.  Positive Finkelstein's on the left  Ultrasound evaluation of bilateral median nerve:  Enlarged median nerve bilaterally right wrist 0.16, left wrist 0.15.   Assessment & Plan:   Problem List Items Addressed This Visit       Nervous and Auditory   Carpal tunnel syndrome, bilateral    Clinical and radiographical evidence of bilateral carpal tunnel syndrome.  Patient was placed in bilateral cock-up splints to be worn as much as possible especially at night while sleeping.  She may continue with Tylenol as needed for pain.  Avoid NSAIDs topical and oral in the setting of pregnancy.  Patient to return to clinic if no improvement with her symptoms.  At that time can consider steroid injections for relief.  Caution in the setting of gestational diabetes.      Other Visit Diagnoses     Bilateral wrist pain    -  Primary   Relevant Orders   08/03/2021 LIMITED JOINT SPACE STRUCTURES UP BILAT       Return if symptoms worsen or fail to improve.    [place], DO

## 2022-03-15 NOTE — Assessment & Plan Note (Signed)
Clinical and radiographical evidence of bilateral carpal tunnel syndrome.  Patient was placed in bilateral cock-up splints to be worn as much as possible especially at night while sleeping.  She may continue with Tylenol as needed for pain.  Avoid NSAIDs topical and oral in the setting of pregnancy.  Patient to return to clinic if no improvement with her symptoms.  At that time can consider steroid injections for relief.  Caution in the setting of gestational diabetes.

## 2022-03-15 NOTE — Patient Instructions (Signed)
You have carpal tunnel in both of your wrists.  We will start with splints for both of your wrist to be worn as often as you can.  You may also take Tylenol for pain relief.  Would avoid all NSAIDs orally and topical during pregnancy.  If your pain does not get better over the next week return to clinic and we can discuss further management such as steroid injection.

## 2022-03-17 ENCOUNTER — Encounter: Payer: Self-pay | Admitting: Family Medicine

## 2022-03-17 ENCOUNTER — Inpatient Hospital Stay (HOSPITAL_COMMUNITY)
Admission: AD | Admit: 2022-03-17 | Discharge: 2022-03-17 | Disposition: A | Discharge status: Home / Self Care | Payer: Medicaid Other | Attending: Obstetrics & Gynecology | Admitting: Obstetrics & Gynecology

## 2022-03-17 ENCOUNTER — Telehealth: Payer: Self-pay | Admitting: *Deleted

## 2022-03-17 ENCOUNTER — Encounter (HOSPITAL_COMMUNITY): Payer: Self-pay | Admitting: Obstetrics & Gynecology

## 2022-03-17 DIAGNOSIS — O479 False labor, unspecified: Secondary | ICD-10-CM

## 2022-03-17 DIAGNOSIS — Z3A32 32 weeks gestation of pregnancy: Secondary | ICD-10-CM | POA: Diagnosis not present

## 2022-03-17 DIAGNOSIS — O4703 False labor before 37 completed weeks of gestation, third trimester: Secondary | ICD-10-CM | POA: Insufficient documentation

## 2022-03-17 LAB — URINALYSIS, ROUTINE W REFLEX MICROSCOPIC
Bilirubin Urine: NEGATIVE
Glucose, UA: NEGATIVE mg/dL
Hgb urine dipstick: NEGATIVE
Ketones, ur: NEGATIVE mg/dL
Leukocytes,Ua: NEGATIVE
Nitrite: NEGATIVE
Protein, ur: NEGATIVE mg/dL
Specific Gravity, Urine: 1.017 (ref 1.005–1.030)
pH: 6 (ref 5.0–8.0)

## 2022-03-17 NOTE — MAU Provider Note (Signed)
History     CSN: [id number]  Arrival date and time: 03/17/22 1159   Event Date/Time   First Provider Initiated Contact with Patient 03/17/22 1300      Chief Complaint  Patient presents with   Contractions   HPI  [name] is a female G1P0 @ 74w4dhere with contractions x 1 week. She says the contractions wax and wane. She has no bleeding. She reports more than 4 contractions in a day. The pain feels like a period cramp in the bottom of her abdomen, and tight at the top of her abdomen. She tried tylenol which has not helped.   Type 2 GDM, was prescribed metformin.   OB History     Gravida  1   Para      Term      Preterm      AB      Living  0      SAB      IAB      Ectopic      Multiple      Live Births              Past Medical History:  Diagnosis Date   ACL tear 08/28/2020   Right Knee   Asthma    [place]   left leg   [place] lung    Past Surgical History:  Procedure Laterality Date   INCISION AND DRAINAGE Right    glute   INCISION AND DRAINAGE ABSCESS Right 2021   right axilla   WISDOM TOOTH EXTRACTION      Family History  Problem Relation Age of Onset   Diabetes Mother    Cancer Father    Clotting disorder Father    Diabetes Father    Breast cancer Paternal Grandmother     Social History   Tobacco Use   Smoking status: Former    Packs/day: 0.50    Types: Cigarettes    Quit date: 09/07/2021    Years since quitting: 0.5   Smokeless tobacco: Never  Vaping Use   Vaping Use: Never used  Substance Use Topics   Alcohol use: Not Currently    Comment: daily, prior to pregnancy   Drug use: Not Currently    Types: Marijuana    Comment: daily, prior to pregnancy    Allergies:  Allergies  Allergen Reactions   Ceftriaxone Other (See Comments) and Shortness Of Breath    Other reaction(s): Seizure Seizure and HTN     Medications Prior to Admission  Medication Sig  Dispense Refill Last Dose   enoxaparin (LOVENOX) 40 MG/0.4ML injection Inject 40 mg into the skin daily.   03/16/2022   Prenatal Vit-Fe Fumarate-FA (PREPLUS) 27-1 MG TABS Take 1 tablet by mouth daily. 30 tablet 13 03/16/2022   Accu-Chek Softclix Lancets lancets 1 each by Other route 4 (four) times daily. Use as instructed 100 each 6    Blood Glucose Monitoring Suppl (ACCU-CHEK GUIDE) w/Device KIT Use glucose meter to monitor blood glucose 4 times daily. 1 kit 0    Blood Pressure Monitoring (BLOOD PRESSURE KIT) DEVI 1 kit by Does not apply route once a week. 1 each 0    Elastic Bandages & Supports (COMFORT FIT MATERNITY SUPP LG) MISC Wear daily when ambulating 1 each 0    glucose blood (ACCU-CHEK GUIDE) test strip 1 each by Other route 4 (four) times daily. Use as instructed 100 each 6  metFORMIN (GLUCOPHAGE) 500 MG tablet Take 1 tablet (500 mg total) by mouth daily with supper. 60 tablet 5    Misc. Devices (GOJJI WEIGHT SCALE) MISC 1 Device by Does not apply route every 30 (thirty) days. 1 each 0    ondansetron (ZOFRAN-ODT) 4 MG disintegrating tablet Take 1 tablet (4 mg total) by mouth every 8 (eight) hours as needed for nausea or vomiting. 20 tablet 0    No results found for this or any previous visit (from the past 24 hour(s)).   Review of Systems  Gastrointestinal:  Positive for abdominal pain.  Genitourinary:  Negative for vaginal bleeding and vaginal discharge.   Physical Exam   Blood pressure 121/79, pulse 93, temperature 97.7 F (36.5 C), temperature source Oral, height _0  (1.499 m), weight 121.7 kg, last menstrual period 08/01/2021.  Physical Exam Constitutional:      General: She is not in acute distress.    Appearance: Normal appearance. She is not ill-appearing, toxic-appearing or diaphoretic.  Genitourinary:    Comments: Dilation: Closed Effacement (%): Thick Cervical Position: Posterior Exam by:: J.Amarii Bordas,NP  Musculoskeletal:        General: Normal range of motion.   Skin:    General: Skin is warm.  Neurological:     Mental Status: She is alert and oriented to person, place, and time.  Psychiatric:        Mood and Affect: Mood normal.    Fetal Tracing: Baseline: 150 bpm Variability: Moderate  Accelerations: 10x10 Decelerations: None Toco: Occasional UI   MAU Course  Procedures  MDM  Cervix is closed, not having pain currently. UA   Assessment and Plan   A:  1. Braxton Hicks contractions   2. [redacted] weeks gestation of pregnancy      P:  Dc home Return to MAU if symptoms worsen Increase oral fluid intake. REST  Placida Cambre, Artist Pais, NP 03/17/2022. 9:29 PM

## 2022-03-17 NOTE — MAU Note (Signed)
.  Rebecca Holloway is a 31 y.o. at [redacted]w[redacted]d here in MAU reporting: lower abdominal cramping and upper abdominal tightening (8/10). The pain began last Friday and has been intermittent. The frequency increased today and she decided to come in. Denies VB or LOF. Reports FM, but does note less movement than normal since yesterday.   Onset of complaint: Last Friday Pain score: 8/10 Vitals:   03/17/22 1227 03/17/22 1230  BP:  120/72  Pulse: (!) 109 (!) 103  Temp: 97.7 F (36.5 C)      FHT:155 Lab orders placed from triage:  UA

## 2022-03-17 NOTE — Telephone Encounter (Signed)
Pt called to office stating she has been having some "cramping in bottom of belly" and "tightness up top". Pt states that she feels them throughout the day and somewhat painful.  Pt states +FM, denies LOF or bleeding.  Pt advised to be seen at hospital for ctx eval.  Pt states she will be seen.

## 2022-03-22 ENCOUNTER — Ambulatory Visit: Payer: Medicaid Other | Admitting: *Deleted

## 2022-03-22 ENCOUNTER — Encounter: Payer: Self-pay | Admitting: Family Medicine

## 2022-03-22 ENCOUNTER — Ambulatory Visit: Payer: Medicaid Other | Attending: Obstetrics

## 2022-03-22 ENCOUNTER — Ambulatory Visit: Payer: Self-pay

## 2022-03-22 ENCOUNTER — Ambulatory Visit (INDEPENDENT_AMBULATORY_CARE_PROVIDER_SITE_OTHER): Payer: Medicaid Other | Admitting: Family Medicine

## 2022-03-22 VITALS — BP 123/82 | HR 104

## 2022-03-22 VITALS — BP 130/67 | Wt 271.0 lb

## 2022-03-22 DIAGNOSIS — O285 Abnormal chromosomal and genetic finding on antenatal screening of mother: Secondary | ICD-10-CM | POA: Diagnosis not present

## 2022-03-22 DIAGNOSIS — O365930 Maternal care for other known or suspected poor fetal growth, third trimester, not applicable or unspecified: Secondary | ICD-10-CM | POA: Diagnosis not present

## 2022-03-22 DIAGNOSIS — O24410 Gestational diabetes mellitus in pregnancy, diet controlled: Secondary | ICD-10-CM | POA: Diagnosis not present

## 2022-03-22 DIAGNOSIS — Z86711 Personal history of pulmonary embolism: Secondary | ICD-10-CM | POA: Diagnosis present

## 2022-03-22 DIAGNOSIS — Z3A33 33 weeks gestation of pregnancy: Secondary | ICD-10-CM

## 2022-03-22 DIAGNOSIS — Z6841 Body Mass Index (BMI) 40.0 and over, adult: Secondary | ICD-10-CM | POA: Diagnosis present

## 2022-03-22 DIAGNOSIS — M654 Radial styloid tenosynovitis [de Quervain]: Secondary | ICD-10-CM

## 2022-03-22 DIAGNOSIS — O283 Abnormal ultrasonic finding on antenatal screening of mother: Secondary | ICD-10-CM | POA: Insufficient documentation

## 2022-03-22 DIAGNOSIS — G5603 Carpal tunnel syndrome, bilateral upper limbs: Secondary | ICD-10-CM

## 2022-03-22 DIAGNOSIS — O99891 Other specified diseases and conditions complicating pregnancy: Secondary | ICD-10-CM

## 2022-03-22 DIAGNOSIS — Z86718 Personal history of other venous thrombosis and embolism: Secondary | ICD-10-CM | POA: Insufficient documentation

## 2022-03-22 DIAGNOSIS — O0990 Supervision of high risk pregnancy, unspecified, unspecified trimester: Secondary | ICD-10-CM

## 2022-03-22 DIAGNOSIS — O99213 Obesity complicating pregnancy, third trimester: Secondary | ICD-10-CM

## 2022-03-22 DIAGNOSIS — D5700 Hb-SS disease with crisis, unspecified: Secondary | ICD-10-CM | POA: Insufficient documentation

## 2022-03-22 DIAGNOSIS — D573 Sickle-cell trait: Secondary | ICD-10-CM

## 2022-03-22 DIAGNOSIS — E669 Obesity, unspecified: Secondary | ICD-10-CM

## 2022-03-22 DIAGNOSIS — O35BXX0 Maternal care for other (suspected) fetal abnormality and damage, fetal cardiac anomalies, not applicable or unspecified: Secondary | ICD-10-CM

## 2022-03-22 MED ORDER — METHYLPREDNISOLONE ACETATE 40 MG/ML IJ SUSP
40.0000 mg | Freq: Once | INTRAMUSCULAR | Status: AC
  Administered 2022-03-22: 40 mg via INTRA_ARTICULAR

## 2022-03-22 NOTE — Progress Notes (Signed)
Established Patient Office Visit  Subjective   Patient ID: Rebecca Holloway, female    DOB: 09-Jan-1991  Age: 31 y.o. MRN: [id number]  Bilateral carpal tunnel injection Rebecca Holloway presents today for follow-up on bilateral carpal tunnel syndrome.  Patient was seen last week and given right and left cock-up splints that she has been wearing as often as possible including sleep.  She reports some improvement in her right hand especially with her pain that was radiating up her arm but she is still experiencing some numbness and tingling into her thumb and weakness of grip secondary to the discomfort.  In her left hand she has had improvement of the numbness and tingling in her fingers however she is still having persistent pain with any abduction or opposition of her thumb.  She has extreme pain when lifting and grabbing heavy items.  Requesting injections today. Of note she is [redacted] weeks pregnant, complicated by gestational diabetes, IUGR and obesity.    Objective:     BP 130/67   Wt 271 lb (122.9 kg)   LMP 08/01/2021 (Approximate)   BMI 54.74 kg/m   Physical Exam Vitals reviewed.  Constitutional:      General: She is not in acute distress.    Appearance: Normal appearance. She is not ill-appearing, toxic-appearing or diaphoretic.  Pulmonary:     Effort: Pulmonary effort is normal.  Neurological:     Mental Status: She is alert.    Right hand: No obvious deformity or asymmetry.  No ecchymosis.  Mild edema.  Positive Tinel's at the wrist.  Relief with spread of the carpal tunnel.  Grip strength 4/5 secondary to pain. Left hand: No obvious deformity or asymmetry.  No ecchymosis or edema.  Positive Tinel's at the wrist.  Grip strength 5/5.  Tenderness over the distal radial aspect of her forearm.  Positive Finkelstein's.      Assessment & Plan:   Problem List Items Addressed This Visit       Nervous and Auditory   Carpal tunnel syndrome, bilateral - Primary    Right carpal tunnel, minimal  relief with cock-up wrist splint.  Patient is here requesting steroid injection.  Procedure as detailed below.  I did explain to patient before procedure that she may have some elevated blood sugars over the next 24 to 48 hours.  I did discuss with patient if she has sustained elevated blood sugars or extremely elevated blood sugars she should notify her OB/GYN team.  She recently started metformin 2 days ago. Left carpal tunnel, she has had some relief with the cock-up wrist splint and improvement in her pain numbness and tingling.  She chose to proceed with conservative management. Recommend remaining in response as tolerated for the next few weeks.      Relevant Orders   [place] LIMITED JOINT SPACE STRUCTURES UP BILAT     Musculoskeletal and Integument   Tenosynovitis, de Quervain    Left-sided tenosynovitis, that is been extremely bothersome.  She is requesting injection, steroid injection performed today, as detailed below.  Patient was again informed that she may have elevated blood sugars, she was instructed to notify her OB/GYN team if blood sugars remain elevated.  She verbalized understanding      After informed written consent timeout was performed, patient was seated in chair next to exam table.  Area overlying right carpal tunnel prepped with alcohol swab then utilizing ultrasound guidance, patient's right carpal tunnel injected with 0.5:0.5 lidocaine: depomedrol.  Patient tolerated procedure well  without immediate complications.   After informed written consent timeout was performed, patient was seated on exam table.  Area overlying left 1st dorsal compartment prepped with alcohol swab then utilizing ultrasound guidance, patient's left dorsal compartment, abductor pollicis longus and extensor pollicis brevus sheath was injected with 1:0.5 lidocaine: depomedrol.  Patient tolerated procedure well without immediate complications.  Return if symptoms worsen or fail to improve.    Claudie Leach, DO

## 2022-03-22 NOTE — Assessment & Plan Note (Addendum)
Right carpal tunnel, minimal relief with cock-up wrist splint.  Patient is here requesting steroid injection.  Procedure as detailed above.  I did explain to patient before procedure that she may have some elevated blood sugars over the next 24 to 48 hours.  I did discuss with patient if she has sustained elevated blood sugars or extremely elevated blood sugars she should notify her OB/GYN team.  She recently started metformin 2 days ago. Left carpal tunnel, she has had some relief with the cock-up wrist splint and improvement in her pain numbness and tingling.  She chose to proceed with conservative management. Recommend remaining in response as tolerated for the next few weeks.

## 2022-03-22 NOTE — Assessment & Plan Note (Signed)
Left-sided tenosynovitis, that is been extremely bothersome.  She is requesting injection, steroid injection performed today, as detailed above.  Patient was again informed that she may have elevated blood sugars, she was instructed to notify her OB/GYN team if blood sugars remain elevated.  She verbalized understanding

## 2022-03-26 ENCOUNTER — Inpatient Hospital Stay (HOSPITAL_COMMUNITY)
Admission: AD | Admit: 2022-03-26 | Discharge: 2022-03-26 | Disposition: A | Discharge status: Home / Self Care | Payer: Medicaid Other | Attending: Family Medicine | Admitting: Family Medicine

## 2022-03-26 ENCOUNTER — Inpatient Hospital Stay (HOSPITAL_COMMUNITY): Payer: Medicaid Other

## 2022-03-26 ENCOUNTER — Emergency Department (HOSPITAL_COMMUNITY): Payer: Medicaid Other

## 2022-03-26 ENCOUNTER — Encounter (HOSPITAL_COMMUNITY): Payer: Self-pay

## 2022-03-26 ENCOUNTER — Other Ambulatory Visit: Payer: Self-pay

## 2022-03-26 DIAGNOSIS — Z86711 Personal history of pulmonary embolism: Secondary | ICD-10-CM | POA: Diagnosis not present

## 2022-03-26 DIAGNOSIS — O26893 Other specified pregnancy related conditions, third trimester: Secondary | ICD-10-CM | POA: Insufficient documentation

## 2022-03-26 DIAGNOSIS — R0789 Other chest pain: Secondary | ICD-10-CM | POA: Insufficient documentation

## 2022-03-26 DIAGNOSIS — Z7901 Long term (current) use of anticoagulants: Secondary | ICD-10-CM | POA: Insufficient documentation

## 2022-03-26 DIAGNOSIS — Z3A33 33 weeks gestation of pregnancy: Secondary | ICD-10-CM

## 2022-03-26 DIAGNOSIS — Z6841 Body Mass Index (BMI) 40.0 and over, adult: Secondary | ICD-10-CM

## 2022-03-26 DIAGNOSIS — O99513 Diseases of the respiratory system complicating pregnancy, third trimester: Secondary | ICD-10-CM | POA: Insufficient documentation

## 2022-03-26 DIAGNOSIS — O24415 Gestational diabetes mellitus in pregnancy, controlled by oral hypoglycemic drugs: Secondary | ICD-10-CM | POA: Diagnosis not present

## 2022-03-26 LAB — URINALYSIS, ROUTINE W REFLEX MICROSCOPIC
Bilirubin Urine: NEGATIVE
Glucose, UA: NEGATIVE mg/dL
Hgb urine dipstick: NEGATIVE
Ketones, ur: NEGATIVE mg/dL
Nitrite: NEGATIVE
Protein, ur: NEGATIVE mg/dL
Specific Gravity, Urine: 1.02 (ref 1.005–1.030)
pH: 6 (ref 5.0–8.0)

## 2022-03-26 LAB — CBC WITH DIFFERENTIAL/PLATELET
Abs Immature Granulocytes: 0.06 10*3/uL (ref 0.00–0.07)
Basophils Absolute: 0 10*3/uL (ref 0.0–0.1)
Basophils Relative: 0 %
Eosinophils Absolute: 0.1 10*3/uL (ref 0.0–0.5)
Eosinophils Relative: 1 %
HCT: 35.3 % — ABNORMAL LOW (ref 36.0–46.0)
Hemoglobin: 11.7 g/dL — ABNORMAL LOW (ref 12.0–15.0)
Immature Granulocytes: 1 %
Lymphocytes Relative: 21 %
Lymphs Abs: 1.9 10*3/uL (ref 0.7–4.0)
MCH: 28.3 pg (ref 26.0–34.0)
MCHC: 33.1 g/dL (ref 30.0–36.0)
MCV: 85.5 fL (ref 80.0–100.0)
Monocytes Absolute: 0.6 10*3/uL (ref 0.1–1.0)
Monocytes Relative: 7 %
Neutro Abs: 6.2 10*3/uL (ref 1.7–7.7)
Neutrophils Relative %: 70 %
Platelets: 239 10*3/uL (ref 150–400)
RBC: 4.13 MIL/uL (ref 3.87–5.11)
RDW: 13.8 % (ref 11.5–15.5)
WBC: 8.9 10*3/uL (ref 4.0–10.5)
nRBC: 0 % (ref 0.0–0.2)

## 2022-03-26 LAB — COMPREHENSIVE METABOLIC PANEL
ALT: 15 U/L (ref 0–44)
AST: 12 U/L — ABNORMAL LOW (ref 15–41)
Albumin: 3.2 g/dL — ABNORMAL LOW (ref 3.5–5.0)
Alkaline Phosphatase: 99 U/L (ref 38–126)
Anion gap: 9 (ref 5–15)
BUN: 9 mg/dL (ref 6–20)
CO2: 20 mmol/L — ABNORMAL LOW (ref 22–32)
Calcium: 9.2 mg/dL (ref 8.9–10.3)
Chloride: 106 mmol/L (ref 98–111)
Creatinine, Ser: 0.57 mg/dL (ref 0.44–1.00)
GFR, Estimated: >60 mL/min (ref >60)
Glucose, Bld: 143 mg/dL — ABNORMAL HIGH (ref 70–99)
Potassium: 3.6 mmol/L (ref 3.5–5.1)
Sodium: 135 mmol/L (ref 135–145)
Total Bilirubin: 0.3 mg/dL (ref 0.3–1.2)
Total Protein: 6.4 g/dL — ABNORMAL LOW (ref 6.5–8.1)

## 2022-03-26 LAB — GLUCOSE, CAPILLARY
Glucose-Capillary: 67 mg/dL — ABNORMAL LOW (ref 70–99)
Glucose-Capillary: 83 mg/dL (ref 70–99)

## 2022-03-26 LAB — URINALYSIS, MICROSCOPIC (REFLEX)

## 2022-03-26 LAB — TROPONIN I (HIGH SENSITIVITY): Troponin I (High Sensitivity): 6 ng/L (ref <18)

## 2022-03-26 LAB — D-DIMER, QUANTITATIVE: D-Dimer, Quant: 5.08 ug/mL-FEU — ABNORMAL HIGH (ref 0.00–0.50)

## 2022-03-26 MED ORDER — IOHEXOL 350 MG/ML SOLN
80.0000 mL | Freq: Once | INTRAVENOUS | Status: AC | PRN
  Administered 2022-03-26: 80 mL via INTRAVENOUS

## 2022-03-26 MED ORDER — ENOXAPARIN SODIUM 40 MG/0.4ML IJ SOSY
40.0000 mg | PREFILLED_SYRINGE | Freq: Once | INTRAMUSCULAR | Status: AC
  Administered 2022-03-26: 40 mg via SUBCUTANEOUS
  Filled 2022-03-26: qty 0.4

## 2022-03-26 NOTE — ED Provider Triage Note (Addendum)
Emergency Medicine Provider Triage Evaluation Note  Rebecca Holloway , a 31 y.o. female  was evaluated in triage.  Pt complains of right-sided pleuritic chest pain since this morning.  Patient has history of DVT, PE and is on Lovenox but has not had a dose since Wednesday.  Patient states that her maternity shelter.  Patient is [redacted] weeks pregnant.  Denies prior history of CAD.  Review of Systems  Positive: As above Negative: As above  Physical Exam  BP 136/88 (BP Location: Right Arm)   Pulse 96   Temp 98.7 F (37.1 C) (Oral)   Resp 20   Ht 4\' 11"  (1.499 m)   Wt 122.9 kg   LMP 08/01/2021 (Approximate)   SpO2 97%   BMI 54.74 kg/m  Gen:   Awake, no distress   Resp:  Normal effort  MSK:   Moves extremities without difficulty  Other:    Medical Decision Making  Medically screening exam initiated at 2:13 PM.  Appropriate orders placed.  Jess Sulak was informed that the remainder of the evaluation will be completed by another provider, this initial triage assessment does not replace that evaluation, and the importance of remaining in the ED until their evaluation is complete.  Discussed with Danielle APP of MAU.  They recommend work-up for ACS down in the emergency room with initial EKG, troponin and if that is normal patient can be transferred to MAU.  They recommend ordering a D-dimer to start their work-up as well.  Work-up reveals troponin of 6, EKG without acute ischemic changes.  Doubt ACS.  D-dimer elevated to 5.  Discussed with MAU APP Danielle.  Patient will be transported to MAU for further work-up of pleuritic chest pain, elevated D-dimer for evaluation of PE.   Theadore Nan, PA-C 03/26/22 1415    03/28/22, PA-C 03/26/22 1544

## 2022-03-26 NOTE — MAU Note (Signed)
Pt completed regular sandwich tray. States she no longer has SOB or any pain. Voices understanding of instructions given per MD with follow up appointments this week.Denies questions or concerns. Ambulatory to house.

## 2022-03-26 NOTE — MAU Provider Note (Cosign Needed Addendum)
History     CSN: [id number]  Arrival date and time: 03/26/22 1334   None     Chief Complaint  Patient presents with   Shortness of Breath   Rebecca Holloway is a 31 y.o. G1P0 at 52w6dwho presents for dyspnea x1 day after being out of lovenox for 4 days.    Shortness of Breath This is a new problem. The current episode started yesterday. Pertinent negatives include no chest pain, coryza, fever, headaches, hemoptysis, leg pain, sore throat, vomiting or wheezing. Exacerbated by: Deep breathing. Associated symptoms comments: Taking a deep breath. Risk factors: Pregnancy. Her past medical history is significant for DVT and PE.   Hx of A2GDM, taking metformin. CBG 70-160.   OB History     Gravida  1   Para      Term      Preterm      AB      Living  0      SAB      IAB      Ectopic      Multiple      Live Births              Past Medical History:  Diagnosis Date   ACL tear 08/28/2020   Right Knee   Asthma    [place]   [place]   [place]   right lung    Past Surgical History:  Procedure Laterality Date   INCISION AND DRAINAGE Right    glute   INCISION AND DRAINAGE ABSCESS Right 2021   right axilla   WISDOM TOOTH EXTRACTION      Family History  Problem Relation Age of Onset   Diabetes Mother    Hypertension Father    Asthma Father    Cancer Father    Clotting disorder Father    Diabetes Father    Asthma Brother    Diabetes Maternal Grandmother    Stroke Maternal Grandfather    Hypertension Maternal Grandfather    Diabetes Maternal Grandfather    Stroke Paternal Grandmother    Hypertension Paternal Grandmother    Diabetes Paternal Grandmother    Breast cancer Paternal Grandmother    Stroke Paternal Grandfather    Hypertension Paternal Grandfather    Diabetes Paternal Grandfather    Birth defects Neg Hx    Heart disease Neg Hx     Social History   Tobacco Use   Smoking status: Former     Packs/day: 0.50    Types: Cigarettes    Quit date: 09/07/2021    Years since quitting: 0.5   Smokeless tobacco: Never  Vaping Use   Vaping Use: Never used  Substance Use Topics   Alcohol use: Not Currently    Comment: daily, prior to pregnancy   Drug use: Not Currently    Types: Marijuana    Comment: daily, prior to pregnancy    Allergies:  Allergies  Allergen Reactions   Ceftriaxone Other (See Comments) and Shortness Of Breath    Other reaction(s): Seizure Seizure and HTN     Medications Prior to Admission  Medication Sig Dispense Refill Last Dose   Accu-Chek Softclix Lancets lancets 1 each by Other route 4 (four) times daily. Use as instructed 100 each 6    Blood Glucose Monitoring Suppl (ACCU-CHEK GUIDE) w/Device KIT Use glucose meter to monitor blood glucose 4 times daily. 1 kit 0    Blood Pressure Monitoring (BLOOD PRESSURE KIT) DEVI 1  kit by Does not apply route once a week. 1 each 0    Elastic Bandages & Supports (COMFORT FIT MATERNITY SUPP LG) MISC Wear daily when ambulating 1 each 0    enoxaparin (LOVENOX) 40 MG/0.4ML injection Inject 40 mg into the skin daily.      glucose blood (ACCU-CHEK GUIDE) test strip 1 each by Other route 4 (four) times daily. Use as instructed 100 each 6    metFORMIN (GLUCOPHAGE) 500 MG tablet Take 1 tablet (500 mg total) by mouth daily with supper. 60 tablet 5    Misc. Devices (GOJJI WEIGHT SCALE) MISC 1 Device by Does not apply route every 30 (thirty) days. 1 each 0    ondansetron (ZOFRAN-ODT) 4 MG disintegrating tablet Take 1 tablet (4 mg total) by mouth every 8 (eight) hours as needed for nausea or vomiting. 20 tablet 0    Prenatal Vit-Fe Fumarate-FA (PREPLUS) 27-1 MG TABS Take 1 tablet by mouth daily. 30 tablet 13     Review of Systems  Constitutional:  Negative for fever.  HENT:  Negative for sore throat.   Respiratory:  Positive for shortness of breath. Negative for hemoptysis and wheezing.   Cardiovascular:  Negative for chest pain.   Gastrointestinal:  Negative for vomiting.  Neurological:  Negative for headaches.   Physical Exam   Blood pressure 125/83, pulse 98, temperature 97.6 F (36.4 C), temperature source Oral, resp. rate 20, height '4\' 11"'  (1.499 m), weight 122.9 kg, last menstrual period 08/01/2021, SpO2 97 %. NST:  Baseline: 150 bpm, Variability: Good {> 6 bpm), Accelerations: Reactive, and Decelerations: Absent  Physical Exam Vitals reviewed.  Constitutional:      General: She is not in acute distress.    Appearance: She is not ill-appearing, toxic-appearing or diaphoretic.  HENT:     Head: Normocephalic.  Cardiovascular:     Rate and Rhythm: Normal rate and regular rhythm.  Pulmonary:     Effort: Pulmonary effort is normal.     Breath sounds: Normal breath sounds. No decreased breath sounds, wheezing, rhonchi or rales.  Abdominal:     Palpations: Abdomen is soft. There is no mass.     Tenderness: There is no abdominal tenderness.  Musculoskeletal:     Right lower leg: Tenderness present. No edema.     Left lower leg: Tenderness present. No edema.  Skin:    Findings: No rash.  Neurological:     Mental Status: She is alert and oriented to person, place, and time.  Psychiatric:        Mood and Affect: Mood normal.        Behavior: Behavior normal.     MAU Course  Procedures  MDM  Transfer from adult [facility] ED. Cardiac work up neg. Trop 6, EKG remarkable for tachycardia, and CXR negative. Elevated d-dimer which could be due to pregnancy. Hx of PE in 09/2020.   1630- No acute distress. Feeling well other than chest discomfort with taking a deep breath. Lung sounds CTAB. HR 90s. Moderate rick for PE per wells criteria. Out of lovenox for 4 days. CTA PE ordered.   UA wnl.   1855- CBG 67. Nothing by mouth since this morning. Give juice. Repeat when back from CT.   1952-CTA negative for PE. CBG 83. Plan to give lovenox here x1. Patient states she get her medication through the mail and was told it  was coming tomorrow. Declined having medication sent to 24 hour pharmacy 2/2 lack of transportation. States chest discomfort  has improved. Has been able to sleep during MAU visit.    Assessment and Plan  1. Chest wall pain Low suspicion for cardiac and infectious etiology given neg trop and ekg, and lack of symptoms and evidence on CXR. Hx of PE; neg CTA. Stable vitals and now asymptomatic.  -Return precautions given  2. [redacted] weeks gestation of pregnancy -Follow up for routine OB care  3. Gestational diabetes mellitus (GDM) in third trimester controlled on oral hypoglycemic drug -Continue metformin  4. History of pulmonary embolus (PE) Declined having medication sent to 24 hour pharmacy 2/2 lack of transportation. 1 dose to be given prior to discharge.  -Restart lovenox when available. Consider increasing dose per BMI.   5. BMI 50.0-59.9, adult ([place])   Cherron Blitzer Autry-Lott 03/26/2022, 4:33 PM

## 2022-03-26 NOTE — Discharge Instructions (Signed)
Return to care if you develop shortness of breath, pain with breathing.  Restart lovenox; consider having this dose increased by your doctor due to your BMI Follow up with your OB for regular routine care

## 2022-03-26 NOTE — MAU Note (Signed)
Apple juice provided for BS     . Pt states she is asymptomatic at this time but is hungry.

## 2022-03-26 NOTE — MAU Note (Signed)
Pt sitting up to eat hospital sandwich tray. Ok'd per MD.

## 2022-03-26 NOTE — MAU Note (Signed)
Pt reports to mau with c/o SOB since yesterday with pain in her right upper chest that radiates to her upper back when taking a deep breath or changing positions.  Denies vag bleeding, or LOF, reports less fetal movement than usual. Denies CTX.  FHR 150

## 2022-03-26 NOTE — ED Triage Notes (Signed)
Pt arrived POV from home c/o pains in her chest and on the side of her rib cage when she lays down. Pt is [redacted] weeks pregnant and has a hx of Pe's and DVTs and wanted to be sure that is not what is going on since she has not been able to take her blood thinner for 4 days d/t being out of her shots.

## 2022-03-28 ENCOUNTER — Encounter: Payer: Self-pay | Admitting: *Deleted

## 2022-03-28 ENCOUNTER — Ambulatory Visit (INDEPENDENT_AMBULATORY_CARE_PROVIDER_SITE_OTHER): Payer: Medicaid Other | Admitting: Obstetrics and Gynecology

## 2022-03-28 ENCOUNTER — Ambulatory Visit (HOSPITAL_BASED_OUTPATIENT_CLINIC_OR_DEPARTMENT_OTHER): Payer: Medicaid Other

## 2022-03-28 ENCOUNTER — Ambulatory Visit: Payer: Medicaid Other

## 2022-03-28 ENCOUNTER — Encounter: Payer: Self-pay | Admitting: Obstetrics and Gynecology

## 2022-03-28 ENCOUNTER — Ambulatory Visit: Payer: Medicaid Other | Attending: Obstetrics | Admitting: *Deleted

## 2022-03-28 VITALS — BP 129/75 | HR 105

## 2022-03-28 VITALS — BP 129/82 | HR 106 | Wt 274.2 lb

## 2022-03-28 DIAGNOSIS — O365930 Maternal care for other known or suspected poor fetal growth, third trimester, not applicable or unspecified: Secondary | ICD-10-CM

## 2022-03-28 DIAGNOSIS — E669 Obesity, unspecified: Secondary | ICD-10-CM

## 2022-03-28 DIAGNOSIS — O99213 Obesity complicating pregnancy, third trimester: Secondary | ICD-10-CM | POA: Insufficient documentation

## 2022-03-28 DIAGNOSIS — Z86711 Personal history of pulmonary embolism: Secondary | ICD-10-CM

## 2022-03-28 DIAGNOSIS — D5700 Hb-SS disease with crisis, unspecified: Secondary | ICD-10-CM | POA: Diagnosis not present

## 2022-03-28 DIAGNOSIS — Z86718 Personal history of other venous thrombosis and embolism: Secondary | ICD-10-CM | POA: Insufficient documentation

## 2022-03-28 DIAGNOSIS — Z7901 Long term (current) use of anticoagulants: Secondary | ICD-10-CM | POA: Insufficient documentation

## 2022-03-28 DIAGNOSIS — D573 Sickle-cell trait: Secondary | ICD-10-CM

## 2022-03-28 DIAGNOSIS — O24410 Gestational diabetes mellitus in pregnancy, diet controlled: Secondary | ICD-10-CM | POA: Insufficient documentation

## 2022-03-28 DIAGNOSIS — Z6841 Body Mass Index (BMI) 40.0 and over, adult: Secondary | ICD-10-CM

## 2022-03-28 DIAGNOSIS — O35BXX0 Maternal care for other (suspected) fetal abnormality and damage, fetal cardiac anomalies, not applicable or unspecified: Secondary | ICD-10-CM

## 2022-03-28 DIAGNOSIS — O0990 Supervision of high risk pregnancy, unspecified, unspecified trimester: Secondary | ICD-10-CM

## 2022-03-28 DIAGNOSIS — O283 Abnormal ultrasonic finding on antenatal screening of mother: Secondary | ICD-10-CM

## 2022-03-28 DIAGNOSIS — Z3A34 34 weeks gestation of pregnancy: Secondary | ICD-10-CM

## 2022-03-28 DIAGNOSIS — O285 Abnormal chromosomal and genetic finding on antenatal screening of mother: Secondary | ICD-10-CM

## 2022-03-28 DIAGNOSIS — O99891 Other specified diseases and conditions complicating pregnancy: Secondary | ICD-10-CM

## 2022-03-28 MED ORDER — SHARPS CONTAINER MISC
1.0000 | 3 refills | Status: DC | PRN
Start: 2022-03-28 — End: 2022-04-23

## 2022-03-28 MED ORDER — PREPLUS 27-1 MG PO TABS: 1.0000 | ORAL_TABLET | Freq: Every day | ORAL | 13 refills | Status: AC

## 2022-03-28 MED ORDER — ENOXAPARIN SODIUM 40 MG/0.4ML IJ SOSY: 40.0000 mg | PREFILLED_SYRINGE | INTRAMUSCULAR | 1 refills | Status: AC

## 2022-03-28 MED ORDER — METFORMIN HCL 500 MG PO TABS: 1000.0000 mg | ORAL_TABLET | Freq: Every day | ORAL | 5 refills | Status: DC

## 2022-03-28 NOTE — Progress Notes (Signed)
Pt reports fetal movement with occasional pressure.  

## 2022-03-28 NOTE — Progress Notes (Signed)
   PRENATAL VISIT NOTE  Subjective:  Rebecca Holloway is a 31 y.o. G1P0 at [redacted]w[redacted]d being seen today for ongoing prenatal care.  She is currently monitored for the following issues for this high-risk pregnancy and has Supervision of high risk pregnancy, antepartum; Pulmonary embolism (HCC); History of pulmonary embolus (PE); BMI 50.0-59.9, adult (HCC); Gestational diabetes mellitus (GDM), antepartum; Carpal tunnel syndrome, bilateral; IUGR (intrauterine growth restriction) affecting care of mother; and Tenosynovitis, de Quervain on their problem list.  Patient reports no complaints.  Contractions: Not present. Vag. Bleeding: None.  Movement: Present. Denies leaking of fluid.   The following portions of the patient's history were reviewed and updated as appropriate: allergies, current medications, past family history, past medical history, past social history, past surgical history and problem list.   Objective:   Vitals:   03/28/22 1401  BP: 129/82  Pulse: (!) 106  Weight: 274 lb 3.2 oz (124.4 kg)    Fetal Status: Fetal Heart Rate (bpm): 143 Fundal Height: 34 cm Movement: Present     General:  Alert, oriented and cooperative. Patient is in no acute distress.  Skin: Skin is warm and dry. No rash noted.   Cardiovascular: Normal heart rate noted  Respiratory: Normal respiratory effort, no problems with respiration noted  Abdomen: Soft, gravid, appropriate for gestational age.  Pain/Pressure: Present     Pelvic: Cervical exam deferred        Extremities: Normal range of motion.  Edema: Trace  Mental Status: Normal mood and affect. Normal behavior. Normal judgment and thought content.   Assessment and Plan:  Pregnancy: G1P0 at [redacted]w[redacted]d 1. Supervision of high risk pregnancy, antepartum Patient is doing well Patient researching pediatricians Plans depo-provera for contraception - Prenatal Vit-Fe Fumarate-FA (PREPLUS) 27-1 MG TABS; Take 1 tablet by mouth daily.  Dispense: 30 tablet; Refill: 13  2.  Gestational diabetes mellitus (GDM), antepartum CBGs reviewed and fasting remain elevated Will increase metformin to 1000 mg qHS Antenatal testing per MFM schedule- next on 8/29 - metFORMIN (GLUCOPHAGE) 500 MG tablet; Take 2 tablets (1,000 mg total) by mouth daily with supper.  Dispense: 60 tablet; Refill: 5  3. History of pulmonary embolus (PE) Patient has not been able to receive lovenox due to pharmacy supply Rx sent to [facility]. Patient instructed to call if not able to receive medication  4. Poor fetal growth affecting management of mother in third trimester, single or unspecified fetus EFW 27%tile today  Preterm labor symptoms and general obstetric precautions including but not limited to vaginal bleeding, contractions, leaking of fluid and fetal movement were reviewed in detail with the patient. Please refer to After Visit Summary for other counseling recommendations.   No follow-ups on file.  Future Appointments  Date Time Provider Department Center  03/29/2022 10:30 AM Lenda Kelp, MD [facility]  04/04/2022 10:30 AM [facility] NURSE [facility]  04/04/2022 10:45 AM [facility] US5 [facility]  04/11/2022 10:15 AM [facility] NURSE [facility]  04/11/2022 10:30 AM [facility] US2 [facility]  04/11/2022  2:30 PM Adam Phenix, MD [facility] None  04/17/2022  3:30 PM Warden Fillers, MD [facility] None  04/24/2022  3:30 PM Dontray Haberland, Gigi Gin, MD [facility] None  05/01/2022  3:50 PM Damaris Abeln, Gigi Gin, MD [facility] None  05/08/2022  2:30 PM Lakeva Hollon, Gigi Gin, MD [facility] None    Catalina Antigua, MD

## 2022-03-29 ENCOUNTER — Ambulatory Visit: Payer: Medicaid Other

## 2022-03-29 ENCOUNTER — Ambulatory Visit (INDEPENDENT_AMBULATORY_CARE_PROVIDER_SITE_OTHER): Payer: Medicaid Other | Admitting: Family Medicine

## 2022-03-29 ENCOUNTER — Ambulatory Visit: Payer: Self-pay

## 2022-03-29 VITALS — BP 136/87 | Ht 59.0 in

## 2022-03-29 DIAGNOSIS — G5603 Carpal tunnel syndrome, bilateral upper limbs: Secondary | ICD-10-CM | POA: Diagnosis present

## 2022-03-29 NOTE — Progress Notes (Unsigned)
   Established Patient Office Visit  Subjective   Patient ID: Rebecca Holloway, female    DOB: Feb 26, 1991  Age: 31 y.o. MRN: [id number]  L hand second finger numbness.  Rebecca Holloway is here today with chief complaint of left hand second finger numbness and tingling on both sides of her finger.  She reports she has been experiencing some of the discomfort, shooting numbness and tingling, since her left hand De Quervain's injection.  She denies any weakness in that hand.  Of note she has had much improvement, resolution in her right carpal tunnel and left hand de Quervain's.  In light of her gestational diabetes patient was keeping an eye on her sugars after steroid injection.  She reports fasting blood sugars mildly elevated, around 110s when she wakes up in the morning.  She was increased to 1000mg  of metformin twice daily.   Objective:     BP 136/87   Ht 4\' 11"  (1.499 m)   LMP 08/01/2021 (Approximate)   BMI 55.38 kg/m   Physical Exam Vitals reviewed.  Constitutional:      General: She is not in acute distress.    Appearance: She is not ill-appearing, toxic-appearing or diaphoretic.  Pulmonary:     Effort: Pulmonary effort is normal.  Neurological:     Mental Status: She is alert.   Left wrist: No obvious deformity or asymmetry.  Small area of ecchymosis on the distal lateral forearm.  Full range of motion flexion extension inversion eversion.  Grip strength 5/5, pincer grip 5/5.  Distal radial pulse 2+ Patient reports slight decrease sensation to light touch over finger both dorsal and volar surface.  Negative Finkelstein's.  Positive Phalen's and Tinel's.    Assessment & Plan:   Problem List Items Addressed This Visit       Nervous and Auditory   Carpal tunnel syndrome, bilateral - Primary    Improvement/resolution of right hand carpal tunnel since injection last week, she is no longer requiring cock up wrist splint on the right. She continues to have carpal tunnel like symptoms of  numbness and tingling on the palmar aspect of her second finger along with the dorsal aspect.  Suspect possible involvement of dorsal radial nerve with injection to de Quervain's last week along with some component of carpal tunnel, median nerve entrapment.  At this point as her symptoms may be secondary to injection recommend 2 weeks of conservative therapy prior to carpal tunnel injection.      Relevant Orders   LIMITED JOINT SPACE STRUCTURES UP LEFT    Return if symptoms worsen or fail to improve.    08/03/2021, DO

## 2022-03-29 NOTE — Assessment & Plan Note (Signed)
Improvement/resolution of right hand carpal tunnel since injection last week, she is no longer requiring cock up wrist splint on the right. She continues to have carpal tunnel like symptoms of numbness and tingling on the palmar aspect of her second finger along with the dorsal aspect.  Suspect possible involvement of dorsal radial nerve with injection to de Quervain's last week along with some component of carpal tunnel, median nerve entrapment.  At this point as her symptoms may be secondary to injection recommend 2 weeks of conservative therapy prior to carpal tunnel injection.

## 2022-03-29 NOTE — Patient Instructions (Signed)
Your numbness and tingling should start improving over the next 2 to 3 weeks.  Avoid icing the area.  You may use Tylenol as needed for discomfort.  Return to clinic in 2 weeks if symptoms are not improving.  You do have carpal tunnel syndrome however the numbness may be a result of the prior injection and should resolve on its own.

## 2022-03-30 ENCOUNTER — Encounter: Payer: Self-pay | Admitting: Family Medicine

## 2022-04-04 ENCOUNTER — Encounter: Payer: Self-pay | Admitting: *Deleted

## 2022-04-04 ENCOUNTER — Ambulatory Visit: Payer: Medicaid Other | Attending: Obstetrics

## 2022-04-04 ENCOUNTER — Ambulatory Visit: Payer: Medicaid Other | Admitting: *Deleted

## 2022-04-04 ENCOUNTER — Other Ambulatory Visit: Payer: Self-pay | Admitting: *Deleted

## 2022-04-04 VITALS — BP 136/88 | HR 91

## 2022-04-04 DIAGNOSIS — D5700 Hb-SS disease with crisis, unspecified: Secondary | ICD-10-CM | POA: Diagnosis present

## 2022-04-04 DIAGNOSIS — Z86711 Personal history of pulmonary embolism: Secondary | ICD-10-CM | POA: Insufficient documentation

## 2022-04-04 DIAGNOSIS — Z86718 Personal history of other venous thrombosis and embolism: Secondary | ICD-10-CM | POA: Insufficient documentation

## 2022-04-04 DIAGNOSIS — O0990 Supervision of high risk pregnancy, unspecified, unspecified trimester: Secondary | ICD-10-CM

## 2022-04-04 DIAGNOSIS — O99213 Obesity complicating pregnancy, third trimester: Secondary | ICD-10-CM

## 2022-04-04 DIAGNOSIS — Z6841 Body Mass Index (BMI) 40.0 and over, adult: Secondary | ICD-10-CM | POA: Diagnosis present

## 2022-04-04 DIAGNOSIS — O283 Abnormal ultrasonic finding on antenatal screening of mother: Secondary | ICD-10-CM | POA: Insufficient documentation

## 2022-04-04 DIAGNOSIS — O24415 Gestational diabetes mellitus in pregnancy, controlled by oral hypoglycemic drugs: Secondary | ICD-10-CM

## 2022-04-04 DIAGNOSIS — O24410 Gestational diabetes mellitus in pregnancy, diet controlled: Secondary | ICD-10-CM | POA: Diagnosis present

## 2022-04-04 DIAGNOSIS — O365930 Maternal care for other known or suspected poor fetal growth, third trimester, not applicable or unspecified: Secondary | ICD-10-CM

## 2022-04-08 ENCOUNTER — Inpatient Hospital Stay (HOSPITAL_COMMUNITY)
Admission: AD | Admit: 2022-04-08 | Discharge: 2022-04-08 | Disposition: A | Discharge status: Home / Self Care | Payer: Medicaid Other | Attending: Obstetrics and Gynecology | Admitting: Obstetrics and Gynecology

## 2022-04-08 ENCOUNTER — Encounter (HOSPITAL_COMMUNITY): Payer: Self-pay | Admitting: Obstetrics and Gynecology

## 2022-04-08 DIAGNOSIS — Z3A35 35 weeks gestation of pregnancy: Secondary | ICD-10-CM | POA: Insufficient documentation

## 2022-04-08 DIAGNOSIS — O0993 Supervision of high risk pregnancy, unspecified, third trimester: Secondary | ICD-10-CM | POA: Insufficient documentation

## 2022-04-08 DIAGNOSIS — R109 Unspecified abdominal pain: Secondary | ICD-10-CM | POA: Diagnosis not present

## 2022-04-08 DIAGNOSIS — O99891 Other specified diseases and conditions complicating pregnancy: Secondary | ICD-10-CM | POA: Diagnosis not present

## 2022-04-08 DIAGNOSIS — O26893 Other specified pregnancy related conditions, third trimester: Secondary | ICD-10-CM | POA: Diagnosis not present

## 2022-04-08 DIAGNOSIS — M549 Dorsalgia, unspecified: Secondary | ICD-10-CM | POA: Insufficient documentation

## 2022-04-08 DIAGNOSIS — O0990 Supervision of high risk pregnancy, unspecified, unspecified trimester: Secondary | ICD-10-CM

## 2022-04-08 LAB — URINALYSIS, ROUTINE W REFLEX MICROSCOPIC
Bilirubin Urine: NEGATIVE
Glucose, UA: NEGATIVE mg/dL
Hgb urine dipstick: NEGATIVE
Ketones, ur: NEGATIVE mg/dL
Nitrite: NEGATIVE
Protein, ur: NEGATIVE mg/dL
Specific Gravity, Urine: 1.014 (ref 1.005–1.030)
pH: 6 (ref 5.0–8.0)

## 2022-04-08 MED ORDER — CYCLOBENZAPRINE HCL 5 MG PO TABS: 5.0000 mg | ORAL_TABLET | Freq: Three times a day (TID) | ORAL | 0 refills | Status: DC | PRN

## 2022-04-08 MED ORDER — CYCLOBENZAPRINE HCL 5 MG PO TABS
10.0000 mg | ORAL_TABLET | Freq: Once | ORAL | Status: AC
  Administered 2022-04-08: 10 mg via ORAL
  Filled 2022-04-08: qty 2

## 2022-04-08 NOTE — MAU Provider Note (Signed)
Chief Complaint:  Abdominal Pain, Back Pain, and Contractions   Event Date/Time   First Provider Initiated Contact with Patient 04/08/22 1135      HPI: Rebecca Holloway is a 31 y.o. G1P0 at 75w5dby LMP who presents to maternity admissions reporting back and abdominal pain x 3 days. She lives at a maternity home and does chores during the day, which include bending and twisting to clean, and walks up stairs daily, which makes her pain worse. She reports abdominal tightening but no regular or timeable contractions.   She reports good fetal movement, denies LOF, vaginal bleeding, vaginal itching/burning, urinary symptoms, h/a, dizziness, n/v, or fever/chills.     HPI  Past Medical History: Past Medical History:  Diagnosis Date   ACL tear 08/28/2020   Right Knee   Asthma    [place]   [place]   [place]   [place] lung    Past obstetric history: OB History  Gravida Para Term Preterm AB Living  1         0  SAB IAB Ectopic Multiple Live Births               # Outcome Date GA Lbr Len/2nd Weight Sex Delivery Anes PTL Lv  1 Current             Past Surgical History: Past Surgical History:  Procedure Laterality Date   INCISION AND DRAINAGE Right    glute   INCISION AND DRAINAGE ABSCESS Right 2021   right axilla   WISDOM TOOTH EXTRACTION      Family History: Family History  Problem Relation Age of Onset   Diabetes Mother    Hypertension Father    Asthma Father    Cancer Father    Clotting disorder Father    Diabetes Father    Asthma Brother    Diabetes Maternal Grandmother    Stroke Maternal Grandfather    Hypertension Maternal Grandfather    Diabetes Maternal Grandfather    Stroke Paternal Grandmother    Hypertension Paternal Grandmother    Diabetes Paternal Grandmother    Breast cancer Paternal Grandmother    Stroke Paternal Grandfather    Hypertension Paternal Grandfather    Diabetes Paternal Grandfather    Birth defects Neg  Hx    Heart disease Neg Hx     Social History: Social History   Tobacco Use   Smoking status: Former    Packs/day: 0.50    Types: Cigarettes    Quit date: 09/07/2021    Years since quitting: 0.5   Smokeless tobacco: Never  Vaping Use   Vaping Use: Never used  Substance Use Topics   Alcohol use: Not Currently    Comment: daily, prior to pregnancy   Drug use: Not Currently    Types: Marijuana    Comment: daily, prior to pregnancy    Allergies:  Allergies  Allergen Reactions   Ceftriaxone Other (See Comments) and Shortness Of Breath    Other reaction(s): Seizure Seizure and HTN     Meds:  No medications prior to admission.    ROS:  Review of Systems  Constitutional:  Negative for chills, fatigue and fever.  Eyes:  Negative for visual disturbance.  Respiratory:  Negative for shortness of breath.   Cardiovascular:  Negative for chest pain.  Gastrointestinal:  Positive for abdominal pain. Negative for nausea and vomiting.  Genitourinary:  Negative for difficulty urinating, dysuria, flank pain, pelvic pain, vaginal bleeding, vaginal discharge  and vaginal pain.  Musculoskeletal:  Positive for back pain.  Neurological:  Negative for dizziness and headaches.  Psychiatric/Behavioral: Negative.       I have reviewed patient's Past Medical Hx, Surgical Hx, Family Hx, Social Hx, medications and allergies.   Physical Exam  Patient Vitals for the past 24 hrs:  BP Temp Temp src Pulse Resp SpO2 Height Weight  04/08/22 1230 135/84 -- -- 92 -- -- -- --  04/08/22 1112 128/76 -- -- 98 -- -- -- --  04/08/22 1100 118/76 -- -- (!) 103 -- 99 % -- --  04/08/22 1034 122/77 98.2 F (36.8 C) Oral (!) 104 17 99 % '4\' 11"'  (1.499 m) 124.1 kg   Constitutional: Well-developed, well-nourished female in no acute distress.  Cardiovascular: normal rate Respiratory: normal effort GI: Abd soft, non-tender, gravid appropriate for gestational age.  MS: Extremities nontender, no edema, normal  ROM Neurologic: Alert and oriented x 4.  GU: Neg CVAT.  PELVIC EXAM:   Dilation: Closed Effacement (%): Thick Station: Ballotable Exam by:: Fatima Blank, CNM  FHT:  Baseline 150 , moderate variability, accelerations present, no decelerations Contractions: irritability, mild to palpation   Labs: Results for orders placed or performed during the hospital encounter of 04/08/22 (from the past 24 hour(s))  Urinalysis, Routine w reflex microscopic Urine, Clean Catch     Status: Abnormal   Collection Time: 04/08/22 10:56 AM  Result Value Ref Range   Color, Urine YELLOW YELLOW   APPearance HAZY (A) CLEAR   Specific Gravity, Urine 1.014 1.005 - 1.030   pH 6.0 5.0 - 8.0   Glucose, UA NEGATIVE NEGATIVE mg/dL   Hgb urine dipstick NEGATIVE NEGATIVE   Bilirubin Urine NEGATIVE NEGATIVE   Ketones, ur NEGATIVE NEGATIVE mg/dL   Protein, ur NEGATIVE NEGATIVE mg/dL   Nitrite NEGATIVE NEGATIVE   Leukocytes,Ua TRACE (A) NEGATIVE   RBC / HPF 0-5 0 - 5 RBC/hpf   WBC, UA 0-5 0 - 5 WBC/hpf   Bacteria, UA MANY (A) NONE SEEN   Squamous Epithelial / LPF 0-5 0 - 5   Mucus PRESENT    O/Positive/-- (04/10 1123)  Imaging:   MAU Course/MDM: Orders Placed This Encounter  Procedures   Culture, OB Urine   Urinalysis, Routine w reflex microscopic Urine, Clean Catch   Discharge patient    Meds ordered this encounter  Medications   cyclobenzaprine (FLEXERIL) tablet 10 mg   cyclobenzaprine (FLEXERIL) 5 MG tablet    Sig: Take 1-2 tablets (5-10 mg total) by mouth 3 (three) times daily as needed for muscle spasms.    Dispense:  30 tablet    Refill:  0    Order Specific Question:   Supervising Provider    Answer:   Donnamae Jude [[id number]]     NST reviewed and reactive No evidence of labor with closed/thick cervix Pain c/w MSK pain, pt active at maternity home with cleaning/chores Flexeril 10 mg given in MAU, pt reports relief of pain Rx for Flexeril for PRN use Letter provided for light duty,  no lifting, twisting, bending activities during pregnancy [facility] scheduled app[facility] at [facility] Return to MAU with signs of labor or emergencies  Assessment: 1. Supervision of high risk pregnancy, antepartum   2. Back pain complicating pregnancy in third trimester   3. Abdominal pain during pregnancy in third trimester   4. [redacted] weeks gestation of pregnancy     Plan: Discharge home Labor precautions and fetal kick counts  Follow-up Information  Jud Follow up.   Why: As scheduled Contact information: [place] [id number] [place] [facility] Follow up.   Specialty: Obstetrics and Gynecology Why: As needed for emergencies Contact information: [address] [id number] [place] [phone]               Allergies as of 04/08/2022       Reactions   Ceftriaxone Other (See Comments), Shortness Of Breath   Other reaction(s): Seizure Seizure and HTN        Medication List     TAKE these medications    Accu-Chek Guide test strip Generic drug: glucose blood 1 each by Other route 4 (four) times daily. Use as instructed   Accu-Chek Guide w/Device Kit Use glucose meter to monitor blood glucose 4 times daily.   Accu-Chek Softclix Lancets lancets 1 each by Other route 4 (four) times daily. Use as instructed   Blood Pressure Kit Devi 1 kit by Does not apply route once a week.   Comfort Fit Maternity Supp Lg Misc Wear daily when ambulating   cyclobenzaprine 5 MG tablet Commonly known as: FLEXERIL Take 1-2 tablets (5-10 mg total) by mouth 3 (three) times daily as needed for muscle spasms.   enoxaparin 40 MG/0.4ML injection Commonly known as: LOVENOX Inject 0.4 mLs (40 mg total) into the skin daily.   Gojji Weight Scale Misc 1 Device by Does not apply route every 30 (thirty) days.   metFORMIN 500 MG tablet Commonly known as:  GLUCOPHAGE Take 2 tablets (1,000 mg total) by mouth daily with supper.   ondansetron 4 MG disintegrating tablet Commonly known as: ZOFRAN-ODT Take 1 tablet (4 mg total) by mouth every 8 (eight) hours as needed for nausea or vomiting.   PrePLUS 27-1 MG Tabs Take 1 tablet by mouth daily.   sharps container 1 each by Does not apply route as needed.        Fatima Blank Certified Nurse-Midwife 04/08/2022 9:01 PM

## 2022-04-08 NOTE — MAU Note (Signed)
...  Rebecca Holloway is a 31 y.o. at [redacted]w[redacted]d here in MAU reporting: Sharp pelvic pain, bilateral lower back pain that is sharp and shooting, and pain with fetal movement that began three days ago. She reports it is hard for her to walk or move at all without these pains increasing. She reports she goes up 19 stairs every day and this is hard for her. Denies VB or LOF. +FM.  Next OB appointment this upcoming Tuesday. She stays [facility] the Room at the [facility].  Tried 500 mg of Tylenol yesterday at 1800 and this did not help.  Onset of complaint: three days ago Pain score:  7/10 lower back 8/10 pelvis  FHT: 166 initial external Lab orders placed from triage: UA

## 2022-04-10 ENCOUNTER — Other Ambulatory Visit (HOSPITAL_BASED_OUTPATIENT_CLINIC_OR_DEPARTMENT_OTHER): Payer: Self-pay | Admitting: Advanced Practice Midwife

## 2022-04-10 ENCOUNTER — Telehealth (HOSPITAL_BASED_OUTPATIENT_CLINIC_OR_DEPARTMENT_OTHER): Payer: Self-pay | Admitting: Advanced Practice Midwife

## 2022-04-10 DIAGNOSIS — O2343 Unspecified infection of urinary tract in pregnancy, third trimester: Secondary | ICD-10-CM

## 2022-04-10 LAB — CULTURE, OB URINE: Culture: 30000 — AB

## 2022-04-10 LAB — OB RESULTS CONSOLE GBS: GBS: POSITIVE

## 2022-04-10 MED ORDER — AMOXICILLIN 875 MG PO TABS: 875.0000 mg | ORAL_TABLET | Freq: Two times a day (BID) | ORAL | 0 refills | Status: AC

## 2022-04-10 NOTE — Telephone Encounter (Signed)
Pt with GBS UTI on 04/08/22, but with ceftriaxone allergy, unable to treat with cephalosporins.  I called pt to discuss medications and she reports she can safely take amoxicillin or penicillin and took Augmentin 12/2021 for URI without complication.  Rx for Amoxicillin 875 BID x 7 days sent to pharmacy.  Pt will need prophylaxis with PCN in labor, and this was discussed with the pt today.  Pt to f/u in office as scheduled, return to [facility]U as needed for emergencies.

## 2022-04-11 ENCOUNTER — Ambulatory Visit: Payer: Medicaid Other | Admitting: *Deleted

## 2022-04-11 ENCOUNTER — Other Ambulatory Visit: Payer: Self-pay | Admitting: Obstetrics

## 2022-04-11 ENCOUNTER — Encounter: Payer: Self-pay | Admitting: Obstetrics & Gynecology

## 2022-04-11 ENCOUNTER — Other Ambulatory Visit (HOSPITAL_COMMUNITY)
Admission: RE | Admit: 2022-04-11 | Discharge: 2022-04-11 | Disposition: A | Discharge status: Home / Self Care | Payer: Medicaid Other | Source: Ambulatory Visit | Attending: Obstetrics & Gynecology | Admitting: Obstetrics & Gynecology

## 2022-04-11 ENCOUNTER — Ambulatory Visit: Payer: Medicaid Other | Attending: Obstetrics

## 2022-04-11 ENCOUNTER — Encounter (HOSPITAL_BASED_OUTPATIENT_CLINIC_OR_DEPARTMENT_OTHER): Payer: Self-pay | Admitting: Advanced Practice Midwife

## 2022-04-11 ENCOUNTER — Encounter: Payer: Self-pay | Admitting: *Deleted

## 2022-04-11 ENCOUNTER — Ambulatory Visit (INDEPENDENT_AMBULATORY_CARE_PROVIDER_SITE_OTHER): Payer: Medicaid Other | Admitting: Obstetrics & Gynecology

## 2022-04-11 VITALS — BP 123/77 | HR 102

## 2022-04-11 VITALS — BP 128/81 | HR 90 | Wt 274.5 lb

## 2022-04-11 DIAGNOSIS — Z6841 Body Mass Index (BMI) 40.0 and over, adult: Secondary | ICD-10-CM | POA: Diagnosis not present

## 2022-04-11 DIAGNOSIS — O24410 Gestational diabetes mellitus in pregnancy, diet controlled: Secondary | ICD-10-CM

## 2022-04-11 DIAGNOSIS — Z3A36 36 weeks gestation of pregnancy: Secondary | ICD-10-CM

## 2022-04-11 DIAGNOSIS — Z86718 Personal history of other venous thrombosis and embolism: Secondary | ICD-10-CM | POA: Diagnosis not present

## 2022-04-11 DIAGNOSIS — O283 Abnormal ultrasonic finding on antenatal screening of mother: Secondary | ICD-10-CM

## 2022-04-11 DIAGNOSIS — Z86711 Personal history of pulmonary embolism: Secondary | ICD-10-CM

## 2022-04-11 DIAGNOSIS — O0990 Supervision of high risk pregnancy, unspecified, unspecified trimester: Secondary | ICD-10-CM | POA: Diagnosis present

## 2022-04-11 DIAGNOSIS — D5700 Hb-SS disease with crisis, unspecified: Secondary | ICD-10-CM

## 2022-04-11 DIAGNOSIS — O0993 Supervision of high risk pregnancy, unspecified, third trimester: Secondary | ICD-10-CM

## 2022-04-11 DIAGNOSIS — O368390 Maternal care for abnormalities of the fetal heart rate or rhythm, unspecified trimester, not applicable or unspecified: Secondary | ICD-10-CM

## 2022-04-11 DIAGNOSIS — O365930 Maternal care for other known or suspected poor fetal growth, third trimester, not applicable or unspecified: Secondary | ICD-10-CM

## 2022-04-11 DIAGNOSIS — B951 Streptococcus, group B, as the cause of diseases classified elsewhere: Secondary | ICD-10-CM | POA: Insufficient documentation

## 2022-04-11 NOTE — Progress Notes (Signed)
Patient presents for routine prenatal visit. Patient states her blood sugars have been "OK". Is not checking her blood pressures regularly.  PHQ negative GAD positive. Patient reports having a good support system.

## 2022-04-11 NOTE — Procedures (Signed)
Rebecca Holloway Jul 18, 1991 [redacted]w[redacted]d  Fetus A Non-Stress Test Interpretation for 04/11/22  Indication:  fetal tachycardia  Fetal Heart Rate A Mode: External Baseline Rate (A): 160 bpm Variability: Minimal, Moderate Accelerations: 15 x 15 Decelerations: None Multiple birth?: No  Uterine Activity Mode: Palpation, Toco Contraction Frequency (min): 1 uc with ui Contraction Duration (sec): 70 Contraction Quality: Mild Resting Tone Palpated: Relaxed Resting Time: Adequate  Interpretation (Fetal Testing) Nonstress Test Interpretation: Reactive Overall Impression: Reassuring for gestational age Comments: Dr. Parke Poisson reviewed tracing

## 2022-04-11 NOTE — Progress Notes (Signed)
   PRENATAL VISIT NOTE  Subjective:  Rebecca Holloway is a 31 y.o. G1P0 at [redacted]w[redacted]d being seen today for ongoing prenatal care.  She is currently monitored for the following issues for this high-risk pregnancy and has Supervision of high risk pregnancy, antepartum; Pulmonary embolism (HCC); History of pulmonary embolus (PE); BMI 50.0-59.9, adult (HCC); Gestational diabetes mellitus (GDM), antepartum; Carpal tunnel syndrome, bilateral; IUGR (intrauterine growth restriction) affecting care of mother; Tenosynovitis, de Quervain; and GBS (group B streptococcus) UTI complicating pregnancy on their problem list.  Patient reports no complaints.  Contractions: Irritability. Vag. Bleeding: None.  Movement: Present. Denies leaking of fluid.   The following portions of the patient's history were reviewed and updated as appropriate: allergies, current medications, past family history, past medical history, past social history, past surgical history and problem list.   Objective:   Vitals:   04/11/22 1426  BP: 128/81  Pulse: 90  Weight: 274 lb 8 oz (124.5 kg)    Fetal Status:     Movement: Present     General:  Alert, oriented and cooperative. Patient is in no acute distress.  Skin: Skin is warm and dry. No rash noted.   Cardiovascular: Normal heart rate noted  Respiratory: Normal respiratory effort, no problems with respiration noted  Abdomen: Soft, gravid, appropriate for gestational age.  Pain/Pressure: Present     Pelvic: Cervical exam deferred        Extremities: Normal range of motion.  Edema: Trace  Mental Status: Normal mood and affect. Normal behavior. Normal judgment and thought content.   Assessment and Plan:  Pregnancy: G1P0 at [redacted]w[redacted]d 1. Supervision of high risk pregnancy, antepartum  - Cervicovaginal ancillary only  2. Diet controlled gestational diabetes mellitus (GDM), antepartum Poor control values up to 180, MFM recommends IOL 37-38 weeks Evaluate for FB placement prior to IOL 3. BMI  50.0-59.9, adult (HCC)   4. History of pulmonary embolus (PE) Lovenox  5. Poor fetal growth affecting management of mother in third trimester, single or unspecified fetus 27%ile  Preterm labor symptoms and general obstetric precautions including but not limited to vaginal bleeding, contractions, leaking of fluid and fetal movement were reviewed in detail with the patient. Please refer to After Visit Summary for other counseling recommendations.   Return in about 1 week (around 04/18/2022).  Future Appointments  Date Time Provider Department Center  04/14/2022  2:00 PM [facility] NURSE [facility]  04/14/2022  2:15 PM [facility]  04/17/2022  3:30 PM Warden Fillers, MD [facility] None  04/18/2022 10:30 AM [facility] NURSE [facility]  04/18/2022 10:45 AM [facility] US[facility]  04/24/2022  3:30 PM Constant, Gigi Gin, MD [facility] None  05/01/2022  3:50 PM Constant, Gigi Gin, MD [facility] None  05/08/2022  2:30 PM Constant, Gigi Gin, MD [facility] None    Scheryl Darter, MD

## 2022-04-12 ENCOUNTER — Encounter: Payer: Self-pay | Admitting: Obstetrics & Gynecology

## 2022-04-12 LAB — CERVICOVAGINAL ANCILLARY ONLY
Chlamydia: NEGATIVE
Comment: NEGATIVE
Comment: NEGATIVE
Comment: NORMAL
Neisseria Gonorrhea: NEGATIVE
Trichomonas: NEGATIVE

## 2022-04-14 ENCOUNTER — Ambulatory Visit: Payer: Medicaid Other | Admitting: *Deleted

## 2022-04-14 ENCOUNTER — Ambulatory Visit: Payer: Medicaid Other | Attending: Obstetrics | Admitting: Obstetrics

## 2022-04-14 VITALS — BP 108/77 | HR 103

## 2022-04-14 DIAGNOSIS — Z3A36 36 weeks gestation of pregnancy: Secondary | ICD-10-CM | POA: Insufficient documentation

## 2022-04-14 DIAGNOSIS — O365990 Maternal care for other known or suspected poor fetal growth, unspecified trimester, not applicable or unspecified: Secondary | ICD-10-CM | POA: Diagnosis present

## 2022-04-14 DIAGNOSIS — O24419 Gestational diabetes mellitus in pregnancy, unspecified control: Secondary | ICD-10-CM | POA: Diagnosis present

## 2022-04-14 DIAGNOSIS — O0990 Supervision of high risk pregnancy, unspecified, unspecified trimester: Secondary | ICD-10-CM

## 2022-04-14 DIAGNOSIS — O365930 Maternal care for other known or suspected poor fetal growth, third trimester, not applicable or unspecified: Secondary | ICD-10-CM | POA: Insufficient documentation

## 2022-04-14 DIAGNOSIS — O99213 Obesity complicating pregnancy, third trimester: Secondary | ICD-10-CM | POA: Diagnosis not present

## 2022-04-14 DIAGNOSIS — B951 Streptococcus, group B, as the cause of diseases classified elsewhere: Secondary | ICD-10-CM

## 2022-04-14 NOTE — Procedures (Signed)
Rebecca Holloway 1990-08-14 103w4d  Fetus A Non-Stress Test Interpretation for 04/14/22  Indication: IUGR and Gestational Diabetes medication controlled  Fetal Heart Rate A Mode: External Baseline Rate (A): 155 bpm Variability: Moderate Accelerations: 15 x 15 Decelerations: None Multiple birth?: No  Uterine Activity Mode: Toco Contraction Frequency (min): irreg Contraction Quality: Mild Resting Tone Palpated: Relaxed Resting Time: Adequate  Interpretation (Fetal Testing) Nonstress Test Interpretation: Reactive Overall Impression: Reassuring for gestational age Comments: tracing reviewed by Dr. Parke Poisson

## 2022-04-17 ENCOUNTER — Encounter: Payer: Self-pay | Admitting: Obstetrics and Gynecology

## 2022-04-17 ENCOUNTER — Ambulatory Visit (INDEPENDENT_AMBULATORY_CARE_PROVIDER_SITE_OTHER): Payer: Medicaid Other | Admitting: Obstetrics and Gynecology

## 2022-04-17 VITALS — BP 125/82 | HR 101 | Wt 279.2 lb

## 2022-04-17 DIAGNOSIS — Z86711 Personal history of pulmonary embolism: Secondary | ICD-10-CM

## 2022-04-17 DIAGNOSIS — O0993 Supervision of high risk pregnancy, unspecified, third trimester: Secondary | ICD-10-CM

## 2022-04-17 DIAGNOSIS — B951 Streptococcus, group B, as the cause of diseases classified elsewhere: Secondary | ICD-10-CM

## 2022-04-17 DIAGNOSIS — O365930 Maternal care for other known or suspected poor fetal growth, third trimester, not applicable or unspecified: Secondary | ICD-10-CM

## 2022-04-17 DIAGNOSIS — O24415 Gestational diabetes mellitus in pregnancy, controlled by oral hypoglycemic drugs: Secondary | ICD-10-CM

## 2022-04-17 DIAGNOSIS — O2343 Unspecified infection of urinary tract in pregnancy, third trimester: Secondary | ICD-10-CM

## 2022-04-17 DIAGNOSIS — O0990 Supervision of high risk pregnancy, unspecified, unspecified trimester: Secondary | ICD-10-CM

## 2022-04-17 DIAGNOSIS — Z3A37 37 weeks gestation of pregnancy: Secondary | ICD-10-CM

## 2022-04-17 DIAGNOSIS — Z6841 Body Mass Index (BMI) 40.0 and over, adult: Secondary | ICD-10-CM

## 2022-04-17 DIAGNOSIS — Z3490 Encounter for supervision of normal pregnancy, unspecified, unspecified trimester: Secondary | ICD-10-CM

## 2022-04-17 DIAGNOSIS — Z3493 Encounter for supervision of normal pregnancy, unspecified, third trimester: Secondary | ICD-10-CM

## 2022-04-17 NOTE — Progress Notes (Signed)
279.2 125/82  101 

## 2022-04-17 NOTE — Progress Notes (Signed)
   PRENATAL VISIT NOTE  Subjective:  Rebecca Holloway is a 31 y.o. G1P0 at [redacted]w[redacted]d being seen today for ongoing prenatal care.  She is currently monitored for the following issues for this high-risk pregnancy and has Supervision of high risk pregnancy, antepartum; Pulmonary embolism (HCC); History of pulmonary embolus (PE); BMI 50.0-59.9, adult (HCC); Gestational diabetes mellitus (GDM), antepartum; Carpal tunnel syndrome, bilateral; IUGR (intrauterine growth restriction) affecting care of mother; Tenosynovitis, de Quervain; and GBS (group B streptococcus) UTI complicating pregnancy on their problem list.  Patient doing well with no acute concerns today. She reports no complaints.  Contractions: Irritability. Vag. Bleeding: None.  Movement: Present. Denies leaking of fluid.   The following portions of the patient's history were reviewed and updated as appropriate: allergies, current medications, past family history, past medical history, past social history, past surgical history and problem list. Problem list updated.  Objective:   Vitals:   04/17/22 1518  BP: 125/82  Pulse: (!) 101  Weight: 279 lb 3.2 oz (126.6 kg)    Fetal Status: Fetal Heart Rate (bpm): 146 Fundal Height: 38 cm Movement: Present     General:  Alert, oriented and cooperative. Patient is in no acute distress.  Skin: Skin is warm and dry. No rash noted.   Cardiovascular: Normal heart rate noted  Respiratory: Normal respiratory effort, no problems with respiration noted  Abdomen: Soft, gravid, appropriate for gestational age.  Pain/Pressure: Present     Pelvic: Cervical exam deferred        Extremities: Normal range of motion.     Mental Status:  Normal mood and affect. Normal behavior. Normal judgment and thought content.   Assessment and Plan:  Pregnancy: G1P0 at [redacted]w[redacted]d  1. Gestational diabetes mellitus (GDM) controlled on oral hypoglycemic drug, antepartum Pt currently has poor blood sugar control FBS: 102-122 PPBS:  147-164,  majority out of range  2. Group B Streptococcus urinary tract infection affecting pregnancy in third trimester Prophylaxis in labor  3. Supervision of high risk pregnancy, antepartum Will schedule IOL just after 37 weeks due to poor blood sugar control with A2 GDM  4. History of pulmonary embolus (PE) Pt will have last dose of lovenox in the AM on 04/18/22 with IOL on 04/19/22 Will consider factor xa and coag panel in initial labor labs  5. BMI 50.0-59.9, adult (HCC)   6. Poor fetal growth affecting management of mother in third trimester, single or unspecified fetus Last growth scan showed EFW of 27%  Term labor symptoms and general obstetric precautions including but not limited to vaginal bleeding, contractions, leaking of fluid and fetal movement were reviewed in detail with the patient.  Please refer to After Visit Summary for other counseling recommendations.   No follow-ups on file.   Mariel Aloe, MD Faculty Attending Center for [facility]

## 2022-04-18 ENCOUNTER — Encounter: Payer: Self-pay | Admitting: *Deleted

## 2022-04-18 ENCOUNTER — Ambulatory Visit: Payer: Medicaid Other | Admitting: *Deleted

## 2022-04-18 ENCOUNTER — Encounter: Payer: Self-pay | Admitting: Obstetrics and Gynecology

## 2022-04-18 ENCOUNTER — Ambulatory Visit (HOSPITAL_BASED_OUTPATIENT_CLINIC_OR_DEPARTMENT_OTHER): Payer: Medicaid Other

## 2022-04-18 ENCOUNTER — Other Ambulatory Visit: Payer: Self-pay | Admitting: Women's Health

## 2022-04-18 VITALS — BP 137/78 | HR 88

## 2022-04-18 DIAGNOSIS — O99213 Obesity complicating pregnancy, third trimester: Secondary | ICD-10-CM | POA: Insufficient documentation

## 2022-04-18 DIAGNOSIS — O35BXX0 Maternal care for other (suspected) fetal abnormality and damage, fetal cardiac anomalies, not applicable or unspecified: Secondary | ICD-10-CM | POA: Diagnosis not present

## 2022-04-18 DIAGNOSIS — O24415 Gestational diabetes mellitus in pregnancy, controlled by oral hypoglycemic drugs: Secondary | ICD-10-CM | POA: Insufficient documentation

## 2022-04-18 DIAGNOSIS — D573 Sickle-cell trait: Secondary | ICD-10-CM

## 2022-04-18 DIAGNOSIS — O0990 Supervision of high risk pregnancy, unspecified, unspecified trimester: Secondary | ICD-10-CM | POA: Insufficient documentation

## 2022-04-18 DIAGNOSIS — Z3A37 37 weeks gestation of pregnancy: Secondary | ICD-10-CM

## 2022-04-18 DIAGNOSIS — E669 Obesity, unspecified: Secondary | ICD-10-CM

## 2022-04-18 DIAGNOSIS — O365930 Maternal care for other known or suspected poor fetal growth, third trimester, not applicable or unspecified: Secondary | ICD-10-CM | POA: Insufficient documentation

## 2022-04-18 DIAGNOSIS — O285 Abnormal chromosomal and genetic finding on antenatal screening of mother: Secondary | ICD-10-CM | POA: Diagnosis not present

## 2022-04-19 ENCOUNTER — Inpatient Hospital Stay (HOSPITAL_COMMUNITY)
Admission: AD | Admit: 2022-04-19 | Discharge: 2022-04-23 | DRG: 807 | Disposition: A | Discharge status: Home / Self Care | Payer: Medicaid Other | Attending: Obstetrics and Gynecology | Admitting: Obstetrics and Gynecology

## 2022-04-19 ENCOUNTER — Encounter (HOSPITAL_COMMUNITY): Payer: Self-pay | Admitting: Obstetrics and Gynecology

## 2022-04-19 ENCOUNTER — Other Ambulatory Visit: Payer: Self-pay

## 2022-04-19 ENCOUNTER — Inpatient Hospital Stay (HOSPITAL_COMMUNITY): Payer: Medicaid Other

## 2022-04-19 DIAGNOSIS — O99824 Streptococcus B carrier state complicating childbirth: Secondary | ICD-10-CM | POA: Diagnosis present

## 2022-04-19 DIAGNOSIS — Z3A37 37 weeks gestation of pregnancy: Secondary | ICD-10-CM | POA: Diagnosis not present

## 2022-04-19 DIAGNOSIS — O24425 Gestational diabetes mellitus in childbirth, controlled by oral hypoglycemic drugs: Principal | ICD-10-CM | POA: Diagnosis present

## 2022-04-19 DIAGNOSIS — O24420 Gestational diabetes mellitus in childbirth, diet controlled: Secondary | ICD-10-CM | POA: Diagnosis not present

## 2022-04-19 DIAGNOSIS — Z86711 Personal history of pulmonary embolism: Secondary | ICD-10-CM | POA: Diagnosis present

## 2022-04-19 DIAGNOSIS — Z86718 Personal history of other venous thrombosis and embolism: Secondary | ICD-10-CM

## 2022-04-19 DIAGNOSIS — O24415 Gestational diabetes mellitus in pregnancy, controlled by oral hypoglycemic drugs: Principal | ICD-10-CM | POA: Diagnosis present

## 2022-04-19 DIAGNOSIS — O24419 Gestational diabetes mellitus in pregnancy, unspecified control: Secondary | ICD-10-CM | POA: Diagnosis present

## 2022-04-19 DIAGNOSIS — Z3490 Encounter for supervision of normal pregnancy, unspecified, unspecified trimester: Secondary | ICD-10-CM

## 2022-04-19 DIAGNOSIS — Z6841 Body Mass Index (BMI) 40.0 and over, adult: Secondary | ICD-10-CM

## 2022-04-19 DIAGNOSIS — O0990 Supervision of high risk pregnancy, unspecified, unspecified trimester: Secondary | ICD-10-CM

## 2022-04-19 DIAGNOSIS — Z87891 Personal history of nicotine dependence: Secondary | ICD-10-CM | POA: Diagnosis not present

## 2022-04-19 DIAGNOSIS — O365930 Maternal care for other known or suspected poor fetal growth, third trimester, not applicable or unspecified: Secondary | ICD-10-CM | POA: Diagnosis present

## 2022-04-19 DIAGNOSIS — O99214 Obesity complicating childbirth: Secondary | ICD-10-CM | POA: Diagnosis present

## 2022-04-19 DIAGNOSIS — B951 Streptococcus, group B, as the cause of diseases classified elsewhere: Secondary | ICD-10-CM | POA: Diagnosis present

## 2022-04-19 DIAGNOSIS — O365931 Maternal care for other known or suspected poor fetal growth, third trimester, fetus 1: Secondary | ICD-10-CM | POA: Diagnosis not present

## 2022-04-19 DIAGNOSIS — O9902 Anemia complicating childbirth: Secondary | ICD-10-CM | POA: Diagnosis present

## 2022-04-19 DIAGNOSIS — O365990 Maternal care for other known or suspected poor fetal growth, unspecified trimester, not applicable or unspecified: Secondary | ICD-10-CM | POA: Diagnosis present

## 2022-04-19 LAB — CBC
HCT: 34.8 % — ABNORMAL LOW (ref 36.0–46.0)
Hemoglobin: 12.1 g/dL (ref 12.0–15.0)
MCH: 28.7 pg (ref 26.0–34.0)
MCHC: 34.8 g/dL (ref 30.0–36.0)
MCV: 82.7 fL (ref 80.0–100.0)
Platelets: 228 10*3/uL (ref 150–400)
RBC: 4.21 MIL/uL (ref 3.87–5.11)
RDW: 14.2 % (ref 11.5–15.5)
WBC: 7.7 10*3/uL (ref 4.0–10.5)
nRBC: 0 % (ref 0.0–0.2)

## 2022-04-19 LAB — GLUCOSE, CAPILLARY
Glucose-Capillary: 116 mg/dL — ABNORMAL HIGH (ref 70–99)
Glucose-Capillary: 123 mg/dL — ABNORMAL HIGH (ref 70–99)
Glucose-Capillary: 79 mg/dL (ref 70–99)
Glucose-Capillary: 80 mg/dL (ref 70–99)

## 2022-04-19 LAB — HEPARIN ANTI-XA: Heparin LMW: <0.1 IU/mL

## 2022-04-19 LAB — COMPREHENSIVE METABOLIC PANEL
ALT: 14 U/L (ref 0–44)
AST: 13 U/L — ABNORMAL LOW (ref 15–41)
Albumin: 2.8 g/dL — ABNORMAL LOW (ref 3.5–5.0)
Alkaline Phosphatase: 124 U/L (ref 38–126)
Anion gap: 6 (ref 5–15)
BUN: <5 mg/dL — ABNORMAL LOW (ref 6–20)
CO2: 22 mmol/L (ref 22–32)
Calcium: 9 mg/dL (ref 8.9–10.3)
Chloride: 109 mmol/L (ref 98–111)
Creatinine, Ser: 0.53 mg/dL (ref 0.44–1.00)
GFR, Estimated: >60 mL/min (ref >60)
Glucose, Bld: 112 mg/dL — ABNORMAL HIGH (ref 70–99)
Potassium: 3.9 mmol/L (ref 3.5–5.1)
Sodium: 137 mmol/L (ref 135–145)
Total Bilirubin: 0.4 mg/dL (ref 0.3–1.2)
Total Protein: 6 g/dL — ABNORMAL LOW (ref 6.5–8.1)

## 2022-04-19 LAB — PROTIME-INR
INR: 0.9 (ref 0.8–1.2)
Prothrombin Time: 12.5 seconds (ref 11.4–15.2)

## 2022-04-19 LAB — HEMOGLOBIN A1C
Hgb A1c MFr Bld: 5.9 % — ABNORMAL HIGH (ref 4.8–5.6)
Mean Plasma Glucose: 122.63 mg/dL

## 2022-04-19 LAB — APTT: aPTT: 28 seconds (ref 24–36)

## 2022-04-19 LAB — TYPE AND SCREEN
ABO/RH(D): O POS
Antibody Screen: NEGATIVE

## 2022-04-19 LAB — RPR: RPR Ser Ql: NONREACTIVE

## 2022-04-19 MED ORDER — LACTATED RINGERS IV SOLN: INTRAVENOUS | Status: DC

## 2022-04-19 MED ORDER — PENICILLIN G POT IN DEXTROSE 60000 UNIT/ML IV SOLN
3.0000 10*6.[IU] | INTRAVENOUS | Status: DC
  Administered 2022-04-19 – 2022-04-21 (×11): 3 10*6.[IU] via INTRAVENOUS
  Filled 2022-04-19 (×11): qty 50

## 2022-04-19 MED ORDER — OXYTOCIN-SODIUM CHLORIDE 30-0.9 UT/500ML-% IV SOLN: 1.0000 m[IU]/min | INTRAVENOUS | Status: DC

## 2022-04-19 MED ORDER — MISOPROSTOL 50MCG HALF TABLET
50.0000 ug | ORAL_TABLET | Freq: Once | ORAL | Status: AC
  Administered 2022-04-19: 50 ug via ORAL
  Filled 2022-04-19: qty 1

## 2022-04-19 MED ORDER — OXYCODONE-ACETAMINOPHEN 5-325 MG PO TABS: 1.0000 | ORAL_TABLET | ORAL | Status: DC | PRN

## 2022-04-19 MED ORDER — MISOPROSTOL 25 MCG QUARTER TABLET
25.0000 ug | ORAL_TABLET | Freq: Once | ORAL | Status: AC
  Administered 2022-04-19: 25 ug via VAGINAL
  Filled 2022-04-19: qty 1

## 2022-04-19 MED ORDER — LIDOCAINE HCL (PF) 1 % IJ SOLN: 30.0000 mL | INTRAMUSCULAR | Status: DC | PRN

## 2022-04-19 MED ORDER — ONDANSETRON HCL 4 MG/2ML IJ SOLN
4.0000 mg | Freq: Four times a day (QID) | INTRAMUSCULAR | Status: DC | PRN
  Administered 2022-04-20 (×2): 4 mg via INTRAVENOUS
  Filled 2022-04-19 (×2): qty 2

## 2022-04-19 MED ORDER — INSULIN REGULAR(HUMAN) IN NACL 100-0.9 UT/100ML-% IV SOLN
INTRAVENOUS | Status: DC
  Filled 2022-04-19 (×2): qty 100

## 2022-04-19 MED ORDER — SOD CITRATE-CITRIC ACID 500-334 MG/5ML PO SOLN: 30.0000 mL | ORAL | Status: DC | PRN

## 2022-04-19 MED ORDER — FENTANYL CITRATE (PF) 100 MCG/2ML IJ SOLN
50.0000 ug | INTRAMUSCULAR | Status: DC | PRN
  Administered 2022-04-19 – 2022-04-20 (×3): 100 ug via INTRAVENOUS
  Filled 2022-04-19 (×3): qty 2

## 2022-04-19 MED ORDER — LACTATED RINGERS IV SOLN
500.0000 mL | INTRAVENOUS | Status: DC | PRN
  Administered 2022-04-20 – 2022-04-21 (×3): 500 mL via INTRAVENOUS

## 2022-04-19 MED ORDER — OXYTOCIN BOLUS FROM INFUSION: 333.0000 mL | Freq: Once | INTRAVENOUS | Status: DC

## 2022-04-19 MED ORDER — OXYTOCIN-SODIUM CHLORIDE 30-0.9 UT/500ML-% IV SOLN
1.0000 m[IU]/min | INTRAVENOUS | Status: DC
  Administered 2022-04-19 (×2): 2 m[IU]/min via INTRAVENOUS
  Filled 2022-04-19: qty 500

## 2022-04-19 MED ORDER — SODIUM CHLORIDE 0.9 % IV SOLN
5.0000 10*6.[IU] | Freq: Once | INTRAVENOUS | Status: AC
  Administered 2022-04-19: 5 10*6.[IU] via INTRAVENOUS
  Filled 2022-04-19: qty 5

## 2022-04-19 MED ORDER — DEXTROSE IN LACTATED RINGERS 5 % IV SOLN: INTRAVENOUS | Status: DC

## 2022-04-19 MED ORDER — TERBUTALINE SULFATE 1 MG/ML IJ SOLN: 0.2500 mg | Freq: Once | INTRAMUSCULAR | Status: DC | PRN

## 2022-04-19 MED ORDER — ACETAMINOPHEN 325 MG PO TABS: 650.0000 mg | ORAL_TABLET | ORAL | Status: DC | PRN

## 2022-04-19 MED ORDER — OXYTOCIN-SODIUM CHLORIDE 30-0.9 UT/500ML-% IV SOLN
2.5000 [IU]/h | INTRAVENOUS | Status: DC
  Filled 2022-04-19: qty 500

## 2022-04-19 MED ORDER — DEXTROSE 50 % IV SOLN: 0.0000 mL | INTRAVENOUS | Status: DC | PRN

## 2022-04-19 MED ORDER — MISOPROSTOL 50MCG HALF TABLET
50.0000 ug | ORAL_TABLET | ORAL | Status: DC | PRN
  Administered 2022-04-19 (×2): 50 ug via ORAL
  Filled 2022-04-19 (×2): qty 1

## 2022-04-19 NOTE — Progress Notes (Signed)
Labor Progress Note Rebecca Holloway is a 31 y.o. G1P0 at [redacted]w[redacted]d presented for IOL due to uncontrolled A1GDM  S: [name] is very uncomfortable with the cook catheter.   O:  BP (!) 140/93   Pulse 95   Temp 97.6 F (36.4 C) (Oral)   Resp 16   Ht 4\' 11"  (1.499 m)   Wt 128.1 kg   LMP 08/01/2021 (Approximate)   BMI 57.02 kg/m  EFM: 150/moderate variability/(+) acels, no decelerations  CVE: Dilation: 1.5 Effacement (%): 30 Station: -2 Presentation: Vertex Exam by:: Dr [email or web address]   A&P: 30 y.o. G1P0 [redacted]w[redacted]d admitted to L&D for IOL due to uncontrolled A1GDM. #Labor: Progressing well. Patient is very uncomfortable with the cook catheter. Discussed removal, epidural and IV pain medications. Patient wants to keep catheter in and will accept IV pain medications.  #Pain: Family support. IV pain medications, trying to avoid epidural.  #FWB: Cat I #GBS positive (on PCN)  #A1GDM: Poorly controlled. BGL max while admitted 123. Continue q4h CBG checks. Will consider endotool if BGL consistently over 140.    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 Will restart 24-48 hours after delivery.   Shandrea Lusk [redacted]w[redacted]d, MD Resident Physician 7:42 PM

## 2022-04-19 NOTE — Inpatient Diabetes Management (Signed)
Inpatient Diabetes Program Recommendations  Diabetes Treatment Program Recommendations  ADA Standards of Care Diabetes in Pregnancy Target Glucose Ranges:  Fasting: 70 - 95 mg/dL 1 hr postprandial: Less than 140mg /dL (from first bite of meal) 2 hr postprandial: Less than 120 mg/dL (from first bite of meal)     Latest Reference Range & Units 04/19/22 09:53  Glucose-Capillary 70 - 99 mg/dL 04/21/22 (H)   Review of Glycemic Control  Diabetes history: GDM; [redacted]W[redacted]D Outpatient Diabetes medications: Metformin 1000 mg QPM Current orders for Inpatient glycemic control: IV insulin  Inpatient Diabetes Program Recommendations:    NOTE: Noted consult with diabetes coordinator. Patient has GDM and takes Metformin outpatient for DM control. Patient is being admitted for IOL. Glucose 123 mg/dl at 993 and noted IV insulin has been ordered. Agree with IV insulin during labor. Anticipate glucose to normalize following delivery and would recommend CBG monitoring after delivery. Will follow along.  Thanks, 5/70/17, RN, MSN, CDCES Diabetes Coordinator Inpatient Diabetes Program [phone] (Team Pager from 8am to 5pm)

## 2022-04-19 NOTE — Progress Notes (Addendum)
Rebecca Holloway is a 31 y.o. G1P0 at [redacted]w[redacted]d admitted for induction of labor due to uncontrolled Gestational diabetes.  Subjective: Patient is very uncomfortable and would like to have her cook catheter removed.  Objective: BP 134/79   Pulse 85   Temp 98.3 F (36.8 C) (Oral)   Resp 16   Ht 4\' 11"  (1.499 m)   Wt 128.1 kg   LMP 08/01/2021 (Approximate)   BMI 57.02 kg/m  No intake/output data recorded. No intake/output data recorded.  FHT:  FHR: 140 bpm, variability: moderate,  accelerations:  Present,  decelerations:  Absent SVE:   Dilation: 4 Effacement (%): 60 Station: -1, Ballotable Exam by:: [email or web address], DO  Labs: Lab Results  Component Value Date   WBC 7.7 04/19/2022   HGB 12.1 04/19/2022   HCT 34.8 (L) 04/19/2022   MCV 82.7 04/19/2022   PLT 228 04/19/2022    Assessment / Plan: Induction of labor due to gestational diabetes, progressing well.  Labor: Removed Cook catheter and started Pitocin 2x2. SVE with ballotable infant head. Plant to AROM at next SVE. Fetal Wellbeing:  Category I Pain Control:  IV pain meds, Nitrous Oxide, and family support. I/D:  GBS positive (on PCN) Anticipated MOD:  NSVD A1GDM: well controlled. BG 116. Continue q4h CBG checks.   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 Factor Xa <0.1 since lovenox was held on 9/12. Plan to restart 24-48 hours after delivery.   11/12, Medical Student 04/19/2022, 11:21 PM _______________ 04/21/2022 of Supervision of Student:  I confirm that I have verified the information documented in the medical student's note and that I have also personally performed the history, physical exam and all medical decision making activities.  I have verified that all services and findings are accurately documented in this student's note; and I agree with management and plan as outlined in the documentation. I have also made any necessary editorial changes.  Flonnie Hailstone, DO Center for  Myrtie Hawk, [facility] 04/20/2022 1:42 AM

## 2022-04-19 NOTE — Progress Notes (Signed)
Labor Progress Note Rebecca Holloway is a 31 y.o. G1P0 at [redacted]w[redacted]d presented for IOL due to uncontrolled A1GDM  S: Rebecca Holloway is doing well. Starting to feel cramping.   O:  BP 129/73   Pulse (!) 103   Temp 97.6 F (36.4 C) (Oral)   Resp 16   Ht 4\' 11"  (1.499 m)   Wt 128.1 kg   LMP 08/01/2021 (Approximate)   BMI 57.02 kg/m  EFM: 150/moderate variability/(+) acels, no decelerations  CVE: Dilation: 1.5 Effacement (%): 30 Station: -2 Presentation: Vertex Exam by:: Dr [email or web address]   A&P: 31 y.o. G1P0 [redacted]w[redacted]d admitted to L&D for IOL due to uncontrolled A1GDM. #Labor: Progressing well. Cervix making change. FB attempted but unable to pass through internal os. 2nd dose [redacted]w[redacted]d cytotec PO given.  #Pain: Family support. IV pain medications, trying to avoid epidural.  #FWB: Cat I #GBS positive (on PCN)  #A1GDM: Poorly controlled. BGL max while admitted 123. Continue q4h CBG checks. Will consider endotool if BGL consistently over 140.    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 Will restart 24-48 hours after delivery.   Rebecca Holloway , MD Resident Physician 3:05 PM

## 2022-04-19 NOTE — H&P (Addendum)
OBSTETRIC ADMISSION HISTORY AND PHYSICAL  Rebecca Holloway is a 31 y.o. female G1P0 with IUP at 45w2dby LMP presenting for IOL due to uncontrolled A2GDM. She reports +FMs, No LOF, no VB, no blurry vision, headaches or peripheral edema, and RUQ pain.  She plans on breast and bottle feeding. She is undecided for birth control. She received her prenatal care at [facility]  Dating: By LMP = 138w1d/S --->  Estimated Date of Delivery: 05/08/22  Sono:    '@[redacted]w[redacted]d' , CWD, normal anatomy, cephalic presentation, [id number]% EFW   Prenatal History/Complications: A1H7WYOncontrolled, borderline IUGR, obesity  Past Medical History: Past Medical History:  Diagnosis Date   ACL tear 08/28/2020   Right Knee   Asthma    [place]   [place]   [place]   right lung    Past Surgical History: Past Surgical History:  Procedure Laterality Date   INCISION AND DRAINAGE Right    glute   INCISION AND DRAINAGE ABSCESS Right 2021   right axilla   WISDOM TOOTH EXTRACTION      Obstetrical History: OB History     Gravida  1   Para      Term      Preterm      AB      Living  0      SAB      IAB      Ectopic      Multiple      Live Births              Social History Social History   Socioeconomic History   Marital status: Single    Spouse name: Not on file   Number of children: Not on file   Years of education: Not on file   Highest education level: Not on file  Occupational History   Not on file  Tobacco Use   Smoking status: Former    Packs/day: 0.50    Types: Cigarettes    Quit date: 09/07/2021    Years since quitting: 0.6   Smokeless tobacco: Never  Vaping Use   Vaping Use: Never used  Substance and Sexual Activity   Alcohol use: Not Currently    Comment: daily, prior to pregnancy   Drug use: Not Currently    Types: Marijuana    Comment: daily, prior to pregnancy   Sexual activity: Not Currently    Partners: Male    Birth  control/protection: None  Other Topics Concern   Not on file  Social History Narrative   Not on file   Social Determinants of Health   Financial Resource Strain: Not on file  Food Insecurity: No Food Insecurity (04/19/2022)   Hunger Vital Sign    Worried About Running Out of Food in the Last Year: Never true    Ran Out of Food in the Last Year: Never true  Transportation Needs: No Transportation Needs (04/19/2022)   PRAPARE - TrHydrologistMedical): No    Lack of Transportation (Non-Medical): No  Physical Activity: Not on file  Stress: Not on file  Social Connections: Not on file    Family History: Family History  Problem Relation Age of Onset   Diabetes Mother    Hypertension Father    Asthma Father    Cancer Father    Clotting disorder Father    Diabetes Father    Asthma Brother    Diabetes Maternal Grandmother  Stroke Maternal Grandfather    Hypertension Maternal Grandfather    Diabetes Maternal Grandfather    Stroke Paternal Grandmother    Hypertension Paternal Grandmother    Diabetes Paternal Grandmother    Breast cancer Paternal Grandmother    Stroke Paternal Grandfather    Hypertension Paternal Grandfather    Diabetes Paternal Grandfather    Birth defects Neg Hx    Heart disease Neg Hx     Allergies: Allergies  Allergen Reactions   Ceftriaxone Shortness Of Breath and Other (See Comments)    Other reaction(s): Seizure Seizure and HTN Pt can take PCN/amoxicillin without complication     Medications Prior to Admission  Medication Sig Dispense Refill Last Dose   cyclobenzaprine (FLEXERIL) 5 MG tablet Take 1-2 tablets (5-10 mg total) by mouth 3 (three) times daily as needed for muscle spasms. 30 tablet 0 04/18/2022   enoxaparin (LOVENOX) 40 MG/0.4ML injection Inject 0.4 mLs (40 mg total) into the skin daily. 30 mL 1 04/18/2022 at 0830   glucose blood (ACCU-CHEK GUIDE) test strip 1 each by Other route 4 (four) times daily. Use as  instructed 100 each 6 04/18/2022   metFORMIN (GLUCOPHAGE) 500 MG tablet Take 2 tablets (1,000 mg total) by mouth daily with supper. 60 tablet 5 04/18/2022   Prenatal Vit-Fe Fumarate-FA (PREPLUS) 27-1 MG TABS Take 1 tablet by mouth daily. 30 tablet 13 04/18/2022   Accu-Chek Softclix Lancets lancets 1 each by Other route 4 (four) times daily. Use as instructed (Patient not taking: Reported on 04/17/2022) 100 each 6    Blood Glucose Monitoring Suppl (ACCU-CHEK GUIDE) w/Device KIT Use glucose meter to monitor blood glucose 4 times daily. (Patient not taking: Reported on 04/17/2022) 1 kit 0    Blood Pressure Monitoring (BLOOD PRESSURE KIT) DEVI 1 kit by Does not apply route once a week. (Patient not taking: Reported on 04/17/2022) 1 each 0    Elastic Bandages & Supports (COMFORT FIT MATERNITY SUPP LG) MISC Wear daily when ambulating (Patient not taking: Reported on 04/17/2022) 1 each 0    Misc. Devices (GOJJI WEIGHT SCALE) MISC 1 Device by Does not apply route every 30 (thirty) days. (Patient not taking: Reported on 04/17/2022) 1 each 0    ondansetron (ZOFRAN-ODT) 4 MG disintegrating tablet Take 1 tablet (4 mg total) by mouth every 8 (eight) hours as needed for nausea or vomiting. 20 tablet 0    sharps container 1 each by Does not apply route as needed. (Patient not taking: Reported on 04/17/2022) 1 each 3      Review of Systems   All systems reviewed and negative except as stated in HPI  Blood pressure 123/75, pulse 89, temperature 98.1 F (36.7 C), temperature source Oral, resp. rate 16, height '4\' 11"'  (1.499 m), weight 128.1 kg, last menstrual period 08/01/2021. General appearance: alert, cooperative, appears stated age, and morbidly obese Lungs: clear to auscultation bilaterally Heart: regular rate and rhythm Abdomen: soft, non-tender; bowel sounds normal Pelvic: no abnormalities Extremities: Homans sign is negative, no sign of DVT DTR's intact Presentation: cephalic Fetal monitoringBaseline: 120  bpm, Variability: Good {> 6 bpm), Accelerations: Reactive, and Decelerations: Absent Uterine activityFrequency: Every 5 minutes  Dilation: Fingertip Effacement (%): Thick Station: -3 Exam by:: Dr Alease Medina   Prenatal labs: ABO, Rh: --/--/O POS (09/13 1015) Antibody: NEG (09/13 1015) Rubella: 1.34 (04/10 1123) RPR: Non Reactive (07/05 1014)  HBsAg: Negative (04/10 1123)  HIV: Non Reactive (07/05 1014)  GBS:  positive 1 hr Glucola Failed 1 and 3  hour GTT Genetic screening  NIPS: LR female, AFP: neg, [place]: [place] carrier Geophysicist/field seismologist [place] appears normal  Prenatal Transfer Tool  Maternal Diabetes: Yes:  Diabetes Type:  Insulin/Medication controlled Genetic Screening: Normal Maternal Ultrasounds/Referrals: IUGR Fetal Ultrasounds or other Referrals:  Referred to Materal Fetal Medicine  Maternal Substance Abuse:  No Significant Maternal Medications:  None Significant Maternal Lab Results:  Group B Strep positive Number of Prenatal Visits:Greater than 3 prenatal visits Other Comments:  None  Results for orders placed or performed during the hospital encounter of 04/19/22 (from the past 24 hour(s))  Glucose, capillary   Collection Time: 04/19/22  9:53 AM  Result Value Ref Range   Glucose-Capillary 123 (H) 70 - 99 mg/dL  Type and screen   Collection Time: 04/19/22 10:15 AM  Result Value Ref Range   ABO/RH(D) O POS    Antibody Screen NEG    Sample Expiration      04/22/2022,2359 Performed at [facility], [place] [address], [place], [place] [address]   CBC   Collection Time: 04/19/22 10:19 AM  Result Value Ref Range   WBC 7.7 4.0 - 10.5 K/uL   RBC 4.21 3.87 - 5.11 MIL/uL   Hemoglobin 12.1 12.0 - 15.0 g/dL   HCT 34.8 (L) 36.0 - 46.0 %   MCV 82.7 80.0 - 100.0 fL   MCH 28.7 26.0 - 34.0 pg   MCHC 34.8 30.0 - 36.0 g/dL   RDW 14.2 11.5 - 15.5 %   Platelets 228 150 - 400 K/uL   nRBC 0.0 0.0 - 0.2 %  Comprehensive metabolic panel   Collection Time: 04/19/22 10:19 AM  Result Value  Ref Range   Sodium 137 135 - 145 mmol/L   Potassium 3.9 3.5 - 5.1 mmol/L   Chloride 109 98 - 111 mmol/L   CO2 22 22 - 32 mmol/L   Glucose, Bld 112 (H) 70 - 99 mg/dL   BUN <5 (L) 6 - 20 mg/dL   Creatinine, Ser 0.53 0.44 - 1.00 mg/dL   Calcium 9.0 8.9 - 10.3 mg/dL   Total Protein 6.0 (L) 6.5 - 8.1 g/dL   Albumin 2.8 (L) 3.5 - 5.0 g/dL   AST 13 (L) 15 - 41 U/L   ALT 14 0 - 44 U/L   Alkaline Phosphatase 124 38 - 126 U/L   Total Bilirubin 0.4 0.3 - 1.2 mg/dL   GFR, Estimated >60 >60 mL/min   Anion gap 6 5 - 15  Protime-INR   Collection Time: 04/19/22 10:19 AM  Result Value Ref Range   Prothrombin Time 12.5 11.4 - 15.2 seconds   INR 0.9 0.8 - 1.2  APTT   Collection Time: 04/19/22 10:19 AM  Result Value Ref Range   aPTT 28 24 - 36 seconds  Low molecular wgt heparin (fractionated) (hepr anti-XA)   Collection Time: 04/19/22 10:19 AM  Result Value Ref Range   Heparin LMW <0.10 IU/mL    Patient Active Problem List   Diagnosis Date Noted   Gestational diabetes mellitus (GDM) controlled on oral hypoglycemic drug 04/19/2022   GBS (group B streptococcus) UTI complicating pregnancy 62/69/4854   Tenosynovitis, de Quervain 03/22/2022   IUGR (intrauterine growth restriction) affecting care of mother 03/14/2022   Gestational diabetes mellitus (GDM), antepartum 02/28/2022   Carpal tunnel syndrome, bilateral 02/28/2022   History of pulmonary embolus (PE) 02/08/2022   BMI 50.0-59.9, adult ([place]) 02/08/2022   Supervision of high risk pregnancy, antepartum 10/25/2021   Pulmonary embolism ([place]) 09/2020  Assessment/Plan:  Rebecca Holloway is a 31 y.o. G1P0 at 108w2dhere for IOL due to poorly controlled A2GDM.   #Labor: Methods of induction discussed with the patient including Cytotec, FB, AROM and pitocin. With shared-decision making started induction with cytotec. #Pain: Family support and IV pain medications. She is trying to avoid an epidural if possible.  #FWB: Cat I tracing #ID:  GBS (+) on  PCN #MOF: Breast and Bottle #MOC: Depo #Circ:  N/A  #A1GDM: Poorly controlled. BGL max while admitted 123. Continue q4h CBG checks.   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 Will restart 24-48 hours after delivery.   CApolonio Schneiders MD  Resident Physician 04/19/2022, 1:38 PM   Attestation of Supervision of Student:  I confirm that I have verified the information documented in the  resident  student's note and that I have also personally reperformed the history, physical exam and all medical decision making activities.  I have verified that all services and findings are accurately documented in this student's note; and I agree with management and plan as outlined in the documentation. I have also made any necessary editorial changes.  -discussed possible Endotool with Dr. NErnestina Patches will continue 4 hour CBG checks for now and start endotool if BS consistently over 1[place]ty [employer] 04/19/2022 2:50 PM

## 2022-04-19 NOTE — Progress Notes (Signed)
Patient ID: Rebecca Holloway, female   DOB: [birth year], 31 y.o.   MRN: [id number]   Rebecca Holloway is a 31 y.o. G1P0 at [redacted]w[redacted]d  admitted for IOL for GMD on Metformin  Subjective: Patient coping well, feeling some discomfort.   Objective: Vitals:   04/19/22 0944 04/19/22 1052 04/19/22 1457  BP: 130/79 123/75 129/73  Pulse: 100 89 (!) 103  Resp: 16 16 16   Temp: 98.1 F (36.7 C)  97.6 F (36.4 C)  TempSrc: Oral  Oral  Weight: 128.1 kg    Height: 4\' 11"  (1.499 m)     No intake/output data recorded.  FHT:  FHR: 150 bpm, variability: moderate,  accelerations:  Present,  decelerations:  Absent UC:   irregular, every 1-2 minutes SVE:   Dilation: 1.5 Effacement (%): 30 Station: -2 Exam by:: Dr Pitocin @ 0 mu/min  Labs: Lab Results  Component Value Date   WBC 7.7 04/19/2022   HGB 12.1 04/19/2022   HCT 34.8 (L) 04/19/2022   MCV 82.7 04/19/2022   PLT 228 04/19/2022    Assessment / Plan: Early labor [place] catheter with 80 cc placed with ease, patient tolerated procedure well Labor:  early labor Fetal Wellbeing:  Category I Pain Control:  Labor support without medications Anticipated MOD:  NSVD  04/21/2022 04/19/2022, 3:58 PM

## 2022-04-19 NOTE — Progress Notes (Addendum)
Labor Progress Note Rebecca Holloway is a 31 y.o. G1P0 at [redacted]w[redacted]d admitted for induction of labor due to uncontrolled gestational diabetes.  Subjective: Patient appears uncomfortable and in pain.   Objective: BP 133/88   Pulse 92   Temp (!) 97.4 F (36.3 C) (Oral)   Resp 16   Ht 4\' 11"  (1.499 m)   Wt 128.1 kg   LMP 08/01/2021 (Approximate)   BMI 57.02 kg/m   FHT:  FHR: 140 bpm, variability: moderate,  accelerations:  Present,  decelerations:  Absent SVE:   Dilation: 1.5 Effacement (%): 30 Station: -2 Exam by:: Dr [email or web address]  Labs: Lab Results  Component Value Date   WBC 7.7 04/19/2022   HGB 12.1 04/19/2022   HCT 34.8 (L) 04/19/2022   MCV 82.7 04/19/2022   PLT 228 04/19/2022    Assessment / Plan: Induction of labor due to uncontrolled gestational diabetes,  progressing well on cytotec with [facility] catheter.   Labor: Progressing well, will wait for catheter to come out then plan for pitocin and AROM if desired by pt.  Preeclampsia:  no signs or symptoms of toxicity Fetal Wellbeing:  Category I Pain Control:  Received 2x IV pain meds, started on Nitrous Oxide, labor support with doula at bedside and ambulation. Holding off on Epidural for now. I/D:   GBS positive (on PCN) Anticipated MOD:  NSVD A1GDM: well controlled. BG 79. Continue q4h CBG checks.  H/O DVT/PE on Lovenox: Discontinued while in labor. Factor Xa <0.1 since lovenox was held on 9/12. Plan to restart 24-48 hours after delivery.    11/12, Medical Student 04/19/2022, 10:27 PM __________________ 04/21/2022 of Supervision of Student:  I confirm that I have verified the information documented in the medical student's note and that I have also personally reperformed the history, physical exam and all medical decision making activities.  I have verified that all services and findings are accurately documented in this student's note; and I agree with management and plan as outlined in the documentation. I have also made  any necessary editorial changes.   Flonnie Hailstone, DO [facility], [facility] 04/19/2022 10:40 PM

## 2022-04-20 ENCOUNTER — Inpatient Hospital Stay (HOSPITAL_COMMUNITY): Payer: Medicaid Other | Admitting: Anesthesiology

## 2022-04-20 LAB — GLUCOSE, CAPILLARY
Glucose-Capillary: 87 mg/dL (ref 70–99)
Glucose-Capillary: 101 mg/dL — ABNORMAL HIGH (ref 70–99)
Glucose-Capillary: 83 mg/dL (ref 70–99)
Glucose-Capillary: 57 mg/dL — ABNORMAL LOW (ref 70–99)
Glucose-Capillary: 110 mg/dL — ABNORMAL HIGH (ref 70–99)
Glucose-Capillary: 70 mg/dL (ref 70–99)

## 2022-04-20 MED ORDER — DIPHENHYDRAMINE HCL 50 MG/ML IJ SOLN: 12.5000 mg | INTRAMUSCULAR | Status: DC | PRN

## 2022-04-20 MED ORDER — LACTATED RINGERS IV SOLN
500.0000 mL | Freq: Once | INTRAVENOUS | Status: AC
  Administered 2022-04-20: 500 mL via INTRAVENOUS

## 2022-04-20 MED ORDER — FENTANYL-BUPIVACAINE-NACL 0.5-0.125-0.9 MG/250ML-% EP SOLN
12.0000 mL/h | EPIDURAL | Status: DC | PRN
  Administered 2022-04-20: 12 mL/h via EPIDURAL
  Filled 2022-04-20 (×2): qty 250

## 2022-04-20 MED ORDER — EPHEDRINE 5 MG/ML INJ: 10.0000 mg | INTRAVENOUS | Status: DC | PRN

## 2022-04-20 MED ORDER — LIDOCAINE-EPINEPHRINE (PF) 1.5 %-1:200000 IJ SOLN
INTRAMUSCULAR | Status: DC | PRN
  Administered 2022-04-20: 5 mL via EPIDURAL

## 2022-04-20 MED ORDER — PHENYLEPHRINE 80 MCG/ML (10ML) SYRINGE FOR IV PUSH (FOR BLOOD PRESSURE SUPPORT)
80.0000 ug | PREFILLED_SYRINGE | INTRAVENOUS | Status: DC | PRN
  Filled 2022-04-20: qty 10

## 2022-04-20 MED ORDER — PHENYLEPHRINE 80 MCG/ML (10ML) SYRINGE FOR IV PUSH (FOR BLOOD PRESSURE SUPPORT): 80.0000 ug | PREFILLED_SYRINGE | INTRAVENOUS | Status: DC | PRN

## 2022-04-20 NOTE — Progress Notes (Signed)
RN entered patient room to assess patient LOC with lower BP; patient does not report lightheadedness, dizziness, or nausea. Patient does not appear symptomatic and fetus does not appear symptomatic. Both stable at present time. VD RN

## 2022-04-20 NOTE — Anesthesia Preprocedure Evaluation (Signed)
Anesthesia Evaluation  Patient identified by MRN, date of birth, ID band Patient awake    Reviewed: Allergy & Precautions, NPO status , Patient's Chart, lab work & pertinent test results  Airway Mallampati: III  TM Distance: >3 FB Neck ROM: Full    Dental no notable dental hx.    Pulmonary asthma , former smoker, PE (02/22, On lovenox (LD 04/18/22))   Pulmonary exam normal        Cardiovascular + DVT   Rhythm:Regular Rate:Normal     Neuro/Psych negative neurological ROS  negative psych ROS   GI/Hepatic negative GI ROS, Neg liver ROS,   Endo/Other  diabetes, Type 2, Oral Hypoglycemic AgentsMorbid obesity  Renal/GU negative Renal ROS  negative genitourinary   Musculoskeletal negative musculoskeletal ROS (+)   Abdominal Normal abdominal exam  (+)   Peds  Hematology   Anesthesia Other Findings   Reproductive/Obstetrics (+) Pregnancy                             Anesthesia Physical Anesthesia Plan  ASA: 3  Anesthesia Plan: Epidural   Post-op Pain Management:    Induction:   PONV Risk Score and Plan: 2 and Treatment may vary due to age or medical condition  Airway Management Planned: Natural Airway  Additional Equipment: None  Intra-op Plan:   Post-operative Plan:   Informed Consent: I have reviewed the patients History and Physical, chart, labs and discussed the procedure including the risks, benefits and alternatives for the proposed anesthesia with the patient or authorized representative who has indicated his/her understanding and acceptance.     Dental advisory given  Plan Discussed with:   Anesthesia Plan Comments: (Lab Results      Component                Value               Date                      WBC                      7.7                 04/19/2022                HGB                      12.1                04/19/2022                HCT                       34.8 (L)            04/19/2022                MCV                      82.7                04/19/2022                PLT                      228  04/19/2022           Lab Results      Component                Value               Date                      NA                       137                 04/19/2022                K                        3.9                 04/19/2022                CO2                      22                  04/19/2022                GLUCOSE                  112 (H)             04/19/2022                BUN                      <5 (L)              04/19/2022                CREATININE               0.53                04/19/2022                CALCIUM                  9.0                 04/19/2022                GFRNONAA                 >60                 04/19/2022          )        Anesthesia Quick Evaluation

## 2022-04-20 NOTE — Progress Notes (Signed)
Rebecca Holloway is a 31 y.o. G1P0 at [redacted]w[redacted]d admitted for induction of labor due to uncontrolled Gestational diabetes.  Subjective: Patient is very uncomfortable. Was laying down for a couple of hours, overall coping with pain well.  Objective: BP 120/66   Pulse 84   Temp 99.6 F (37.6 C) (Axillary)   Resp 18   Ht 4\' 11"  (1.499 m)   Wt 128.1 kg   LMP 08/01/2021 (Approximate)   BMI 57.02 kg/m  No intake/output data recorded. No intake/output data recorded.  FHT:  FHR: 140 bpm, variability: moderate,  accelerations:  Present,  decelerations:  Absent SVE:   Dilation: 4 Effacement (%): 60 Station: [place], -1 Exam by:: [email or web address], DO  Labs: Lab Results  Component Value Date   WBC 7.7 04/19/2022   HGB 12.1 04/19/2022   HCT 34.8 (L) 04/19/2022   MCV 82.7 04/19/2022   PLT 228 04/19/2022    Assessment / Plan: Induction of labor due to gestational diabetes, progressing well.  Labor: cont pitocin, SVE still with ballotable infant head. Encourage maternal positioning and ambulation to open pelvis and allow fetal descent. Plant to AROM at next SVE if not ballotable. Fetal Wellbeing:  Category I Pain Control:  IV pain meds, Nitrous Oxide, and family support. I/D:  GBS positive (on PCN) Anticipated MOD:  NSVD A1GDM: well controlled. BG 116. Continue q4h CBG checks.   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 Factor Xa <0.1 since lovenox was held on 9/12. Plan to restart 24-48 hours after delivery.   11/12 Mercado-Ortiz, DO 04/20/2022, 3:07 AM

## 2022-04-20 NOTE — Anesthesia Procedure Notes (Signed)
Epidural Patient location during procedure: OB Start time: 04/20/2022 7:55 AM End time: 04/20/2022 8:00 AM  Staffing Anesthesiologist: Atilano Median, DO Performed: anesthesiologist   Preanesthetic Checklist Completed: patient identified, IV checked, site marked, risks and benefits discussed, surgical consent, monitors and equipment checked, pre-op evaluation and timeout performed  Epidural Patient position: sitting Prep: ChloraPrep Patient monitoring: heart rate, continuous pulse ox and blood pressure Approach: midline Location: L3-L4 Injection technique: LOR saline  Needle:  Needle type: Tuohy  Needle gauge: 17 G Needle length: 9 cm Needle insertion depth: 9 cm Catheter type: closed end flexible Catheter size: 20 Guage Catheter at skin depth: 15 cm Test dose: negative and 1.5% lidocaine with Epi 1:200 K  Assessment Events: blood not aspirated, injection not painful, no injection resistance and no paresthesia  Additional Notes Patient identified. Risks/Benefits/Options discussed with patient including but not limited to bleeding, infection, nerve damage, paralysis, failed block, incomplete pain control, headache, blood pressure changes, nausea, vomiting, reactions to medications, itching and postpartum back pain. Confirmed with bedside nurse the patient's most recent platelet count. Confirmed with patient that they are not currently taking any anticoagulation, have any bleeding history or any family history of bleeding disorders. Patient expressed understanding and wished to proceed. All questions were answered. Sterile technique was used throughout the entire procedure. Please see nursing notes for vital signs. Test dose was given through epidural catheter and negative prior to continuing to dose epidural or start infusion. Warning signs of high block given to the patient including shortness of breath, tingling/numbness in hands, complete motor block, or any concerning  symptoms with instructions to call for help. Patient was given instructions on fall risk and not to get out of bed. All questions and concerns addressed with instructions to call with any issues or inadequate analgesia.    Reason for block:procedure for pain

## 2022-04-20 NOTE — Progress Notes (Signed)
Labor Progress Note Ilona Colley is a 31 y.o. G1P0 at [redacted]w[redacted]d admitted for induction of labor due to uncontrolled Gestational diabetes.   S:  Aleesa is feeling better. Still a little frustrated with the process.   O:  BP (!) 94/50   Pulse 81   Temp 98.1 F (36.7 C)   Resp 18   Ht 4\' 11"  (1.499 m)   Wt 128.1 kg   LMP 08/01/2021 (Approximate)   SpO2 98%   BMI 57.02 kg/m  EFM: 140/moderate variability/no acels, no decelerations  CVE: Dilation: 4 Effacement (%): 80, 90 Station: -1 Presentation: Vertex Exam by:: [email or web address], RN   A&P: 31 y.o. G1P0 [redacted]w[redacted]d admitted for IOL due to uncontrolled GDM  #Labor: Progressing well. Cervix is thinning. Currently on 52mu/min of pitocin. IUPC in place. Will reassess in 4 hours.  #Pain: Epidural in place. Family support #FWB: Cat I #GBS positive PCN - treated adequately   A1GDM: well controlled. Last BGL in the 50's. Patient eating. Will reassess after food.  Continue q4h CBG checks.   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 Factor Xa <0.1 since lovenox was held on 9/12. Plan to restart 12-24 hours after delivery.   Ellice Boultinghouse 11/12, MD Resident Physician 5:20 PM

## 2022-04-20 NOTE — Progress Notes (Addendum)
Aleanna Menge is a 31 y.o. G1P0 at 107w2d admitted for induction of labor due to uncontrolled Gestational diabetes.  Subjective: Patient is very uncomfortable but reports being able to do some ambulation in the room. AROM was performed and patient agreed to epidural for pain relief.    Objective: BP (!) 115/54   Pulse 88   Temp 97.8 F (36.6 C) (Oral)   Resp 18   Ht 4\' 11"  (1.499 m)   Wt 128.1 kg   LMP 08/01/2021 (Approximate)   BMI 57.02 kg/m   FHT:  FHR: 150 bpm, variability: minimal ,  accelerations:  Present,  decelerations:  Absent SVE:   Dilation: 5 Effacement (%): 60 Station: 0 Exam by:: [email or web address], DO  Labs: Lab Results  Component Value Date   WBC 7.7 04/19/2022   HGB 12.1 04/19/2022   HCT 34.8 (L) 04/19/2022   MCV 82.7 04/19/2022   PLT 228 04/19/2022    Assessment / Plan: Induction of labor due to gestational diabetes, progressing well.  Labor: AROM completed at 0707, cont pitocin augmentation Fetal Wellbeing:  Category I Pain Control:  epidural ordered. I/D:  GBS positive (on PCN) Anticipated MOD:  NSVD A1GDM: well controlled. BG 116. Continue q4h CBG checks.   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 Factor Xa <0.1 since lovenox was held on 9/12. Plan to restart 24-48 hours after delivery.   11/12, Medical Student 04/20/2022, 7:18 AM  ______________ 04/22/2022 of Supervision of Student:  I confirm that I have verified the information documented in the medical student's note and that I have also personally performed the history, physical exam and all medical decision making activities.  I have verified that all services and findings are accurately documented in this student's note; and I agree with management and plan as outlined in the documentation. I have also made any necessary editorial changes.   Flonnie Hailstone, DO [facility], [facility] 04/20/2022 7:38 AM

## 2022-04-20 NOTE — Progress Notes (Signed)
Labor Progress Note Jahnae Mcadoo is a 31 y.o. G1P0 at [redacted]w[redacted]d admitted for induction of labor due to uncontrolled Gestational diabetes.   S:  Edy is getting a little frustrated with her progress.   O:  BP 105/66   Pulse 87   Temp 98.1 F (36.7 C)   Resp 18   Ht 4\' 11"  (1.499 m)   Wt 128.1 kg   LMP 08/01/2021 (Approximate)   SpO2 98%   BMI 57.02 kg/m  EFM: 145/moderate variability/(+) acels, no decelerations  CVE: Dilation: 4 Effacement (%): 50 Station: -1 Presentation: Vertex Exam by:: Casion, MD   A&P: 31 y.o. G1P0 [redacted]w[redacted]d admitted for IOL due to uncontrolled GDM #Labor: Stalled, no cervical change in a few checks. Currently on 85mu/min of pitocin. IUPC placed to measure contraction effectiveness.  #Pain: Epidural in place. Family support #FWB: Cat II, variables resolve with position change.  #GBS positive PCN - treated adequately  A1GDM: well controlled. BG 116. Continue q4h CBG checks.   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 Factor Xa <0.1 since lovenox was held on 9/12. Plan to restart 12-24 hours after delivery.   Jatasia Gundrum 11/12, MD Resident Physician 1:52 PM

## 2022-04-21 ENCOUNTER — Encounter (HOSPITAL_COMMUNITY): Payer: Self-pay | Admitting: Obstetrics and Gynecology

## 2022-04-21 DIAGNOSIS — O99824 Streptococcus B carrier state complicating childbirth: Secondary | ICD-10-CM

## 2022-04-21 DIAGNOSIS — Z3A37 37 weeks gestation of pregnancy: Secondary | ICD-10-CM

## 2022-04-21 DIAGNOSIS — O365931 Maternal care for other known or suspected poor fetal growth, third trimester, fetus 1: Secondary | ICD-10-CM

## 2022-04-21 DIAGNOSIS — O24420 Gestational diabetes mellitus in childbirth, diet controlled: Secondary | ICD-10-CM

## 2022-04-21 LAB — GLUCOSE, CAPILLARY
Glucose-Capillary: 78 mg/dL (ref 70–99)
Glucose-Capillary: 100 mg/dL — ABNORMAL HIGH (ref 70–99)
Glucose-Capillary: 79 mg/dL (ref 70–99)
Glucose-Capillary: 78 mg/dL (ref 70–99)

## 2022-04-21 MED ORDER — ACETAMINOPHEN 325 MG PO TABS: 650.0000 mg | ORAL_TABLET | ORAL | Status: DC | PRN

## 2022-04-21 MED ORDER — ENOXAPARIN SODIUM 40 MG/0.4ML IJ SOSY
40.0000 mg | PREFILLED_SYRINGE | INTRAMUSCULAR | Status: DC
  Administered 2022-04-22 – 2022-04-23 (×2): 40 mg via SUBCUTANEOUS
  Filled 2022-04-21 (×2): qty 0.4

## 2022-04-21 MED ORDER — ONDANSETRON HCL 4 MG/2ML IJ SOLN: 4.0000 mg | INTRAMUSCULAR | Status: DC | PRN

## 2022-04-21 MED ORDER — TETANUS-DIPHTH-ACELL PERTUSSIS 5-2.5-18.5 LF-MCG/0.5 IM SUSY: 0.5000 mL | PREFILLED_SYRINGE | Freq: Once | INTRAMUSCULAR | Status: DC

## 2022-04-21 MED ORDER — SIMETHICONE 80 MG PO CHEW
80.0000 mg | CHEWABLE_TABLET | ORAL | Status: DC | PRN
  Administered 2022-04-22: 80 mg via ORAL
  Filled 2022-04-21: qty 1

## 2022-04-21 MED ORDER — BENZOCAINE-MENTHOL 20-0.5 % EX AERO
1.0000 | INHALATION_SPRAY | CUTANEOUS | Status: DC | PRN
  Administered 2022-04-21: 1 via TOPICAL
  Filled 2022-04-21: qty 56

## 2022-04-21 MED ORDER — WITCH HAZEL-GLYCERIN EX PADS: 1.0000 | MEDICATED_PAD | CUTANEOUS | Status: DC | PRN

## 2022-04-21 MED ORDER — DIPHENHYDRAMINE HCL 25 MG PO CAPS: 25.0000 mg | ORAL_CAPSULE | Freq: Four times a day (QID) | ORAL | Status: DC | PRN

## 2022-04-21 MED ORDER — IBUPROFEN 600 MG PO TABS
600.0000 mg | ORAL_TABLET | Freq: Four times a day (QID) | ORAL | Status: DC
  Administered 2022-04-21 – 2022-04-23 (×7): 600 mg via ORAL
  Filled 2022-04-21 (×8): qty 1

## 2022-04-21 MED ORDER — ONDANSETRON HCL 4 MG PO TABS: 4.0000 mg | ORAL_TABLET | ORAL | Status: DC | PRN

## 2022-04-21 MED ORDER — COCONUT OIL OIL: 1.0000 | TOPICAL_OIL | Status: DC | PRN

## 2022-04-21 MED ORDER — ZOLPIDEM TARTRATE 5 MG PO TABS: 5.0000 mg | ORAL_TABLET | Freq: Every evening | ORAL | Status: DC | PRN

## 2022-04-21 MED ORDER — SENNOSIDES-DOCUSATE SODIUM 8.6-50 MG PO TABS
2.0000 | ORAL_TABLET | Freq: Every day | ORAL | Status: DC
  Administered 2022-04-22 – 2022-04-23 (×2): 2 via ORAL
  Filled 2022-04-21 (×2): qty 2

## 2022-04-21 MED ORDER — DIBUCAINE (PERIANAL) 1 % EX OINT: 1.0000 | TOPICAL_OINTMENT | CUTANEOUS | Status: DC | PRN

## 2022-04-21 MED ORDER — PRENATAL MULTIVITAMIN CH
1.0000 | ORAL_TABLET | Freq: Every day | ORAL | Status: DC
  Administered 2022-04-22 – 2022-04-23 (×2): 1 via ORAL
  Filled 2022-04-21 (×2): qty 1

## 2022-04-21 NOTE — Progress Notes (Signed)
Rebecca Holloway is a 31 y.o. G1P0 at [redacted]w[redacted]d by LMP admitted for induction of labor due to Gestational diabetes.  Subjective:   Comfortable with epidural.    Objective: BP 119/70   Pulse 81   Temp 99.3 F (37.4 C) (Axillary)   Resp 15   Ht 4\' 11"  (1.499 m)   Wt 128.1 kg   LMP 08/01/2021 (Approximate)   SpO2 98%   BMI 57.02 kg/m  I/O last 3 completed shifts: In: -  Out: 1875 [Urine:1875] Total I/O In: -  Out: 750 [Urine:750]  FHT:  FHR: 145 bpm, variability: moderate,  accelerations:  Present,  decelerations: present, occasional late decels. UC:   regular, every 3-4 minutes SVE:   Dilation: 8 Effacement (%): 90 Station: 0 Exam by:: [email or web address], SNM  Labs: Lab Results  Component Value Date   WBC 7.7 04/19/2022   HGB 12.1 04/19/2022   HCT 34.8 (L) 04/19/2022   MCV 82.7 04/19/2022   PLT 228 04/19/2022    Assessment / Plan: Induction of labor due to gestational diabetes,  progressing well on pitocin  Labor: Progressing normally, continue pitocin, maternal position changes. Fetal Wellbeing:  Category I Pain Control:  Epidural I/D: GBS +,on PCN, adequately treated Anticipated MOD:  NSVD H/O DVT/PE - Lovenox discontinued while in labor. Plan to restart 24-48 hrs after delivery.  04/21/2022 [place], Student-MidWife 04/21/2022, 1:20 AM

## 2022-04-21 NOTE — Discharge Summary (Signed)
Postpartum Discharge Summary  Date of Service updated- yes     Patient Name: Rebecca Holloway DOB: January 06, 1991 MRN: [id number]  Date of admission: 04/19/2022 Delivery date:04/21/2022  Delivering provider: Concepcion Living  Date of discharge: 04/23/2022  Admitting diagnosis: Gestational diabetes mellitus (GDM) controlled on oral hypoglycemic drug [O24.415] Intrauterine pregnancy: [redacted]w[redacted]d    Secondary diagnosis:  Principal Problem:   Gestational diabetes mellitus (GDM) controlled on oral hypoglycemic drug Active Problems:   Supervision of high risk pregnancy, antepartum   History of pulmonary embolus (PE)   BMI 50.0-59.9, adult (HCC)   Gestational diabetes mellitus (GDM), antepartum   IUGR (intrauterine growth restriction) affecting care of mother   GBS (group B streptococcus) UTI complicating pregnancy   Vaginal delivery  Additional problems: None    Discharge diagnosis: Term Pregnancy Delivered                                              Post partum procedures: none Augmentation: AROM, Pitocin, Cytotec, and IP Foley Complications: None  Hospital course: Induction of Labor With Vaginal Delivery   31y.o. yo G1P0 at 324w4das admitted to the hospital 04/19/2022 for induction of labor.  Indication for induction: A2 DM and FGR .  Patient had an uncomplicated labor course as follows: Membrane Rupture Time/Date: 7:09 AM ,04/20/2022   Delivery Method:Vaginal, Spontaneous  Episiotomy: None  Lacerations:  None  Details of delivery can be found in separate delivery note.  Patient had a routine postpartum course. Patient is discharged home 04/23/22. She has a history of DVTs and will be continued on lovenox until 6 weeks post partum  Newborn Data: Birth date:04/21/2022  Birth time:11:20 AM  Gender:Female  Living status:Living  Apgars:7 ,9  Weight:2390 g   Magnesium Sulfate received: No BMZ received: No Rhophylac:N/A MMR:N/A, Immune T-DaP:Given prenatally Flu:  offer Transfusion:No  Physical exam  Vitals:   04/22/22 0530 04/22/22 1353 04/22/22 1934 04/23/22 0525  BP: 125/70 120/70 132/84 131/84  Pulse: 92 87 99 98  Resp: '16 20 18 17  ' Temp: 98.3 F (36.8 C) 97.9 F (36.6 C) 98.9 F (37.2 C) 98.6 F (37 C)  TempSrc: Oral Oral Oral Oral  SpO2: 100%     Weight:      Height:       General: alert, cooperative, and no distress Lochia: appropriate Uterine Fundus: firm Incision: N/A DVT Evaluation: No evidence of DVT seen on physical exam. Labs: Lab Results  Component Value Date   WBC 9.7 04/22/2022   HGB 10.9 (L) 04/22/2022   HCT 32.0 (L) 04/22/2022   MCV 84.0 04/22/2022   PLT 209 04/22/2022      Latest Ref Rng & Units 04/19/2022   10:19 AM  CMP  Glucose 70 - 99 mg/dL 112   BUN 6 - 20 mg/dL <5   Creatinine 0.44 - 1.00 mg/dL 0.53   Sodium 135 - 145 mmol/L 137   Potassium 3.5 - 5.1 mmol/L 3.9   Chloride 98 - 111 mmol/L 109   CO2 22 - 32 mmol/L 22   Calcium 8.9 - 10.3 mg/dL 9.0   Total Protein 6.5 - 8.1 g/dL 6.0   Total Bilirubin 0.3 - 1.2 mg/dL 0.4   Alkaline Phos 38 - 126 U/L 124   AST 15 - 41 U/L 13   ALT 0 - 44 U/L 14  Edinburgh Score:    04/21/2022    7:45 PM  Edinburgh Postnatal Depression Scale Screening Tool  I have been able to laugh and see the funny side of things. 0  I have looked forward with enjoyment to things. 0  I have blamed myself unnecessarily when things went wrong. 1  I have been anxious or worried for no good reason. 1  I have felt scared or panicky for no good reason. 0  Things have been getting on top of me. 1  I have been so unhappy that I have had difficulty sleeping. 0  I have felt sad or miserable. 0  I have been so unhappy that I have been crying. 1  The thought of harming myself has occurred to me. 0  Edinburgh Postnatal Depression Scale Total 4     After visit meds:  Allergies as of 04/23/2022       Reactions   Ceftriaxone Shortness Of Breath, Other (See Comments), Hypertension    Seizure, also Pt can take PCN/amoxicillin without complication        Medication List     STOP taking these medications    Accu-Chek Guide test strip Generic drug: glucose blood   Accu-Chek Softclix Lancets lancets   cyclobenzaprine 5 MG tablet Commonly known as: FLEXERIL   metFORMIN 500 MG tablet Commonly known as: GLUCOPHAGE   ondansetron 4 MG disintegrating tablet Commonly known as: ZOFRAN-ODT   sharps container       TAKE these medications    enoxaparin 40 MG/0.4ML injection Commonly known as: LOVENOX Inject 0.4 mLs (40 mg total) into the skin daily.   PrePLUS 27-1 MG Tabs Take 1 tablet by mouth daily.         Discharge home in stable condition Infant Feeding: Bottle and Breast Infant Disposition:home with mother Discharge instruction: per After Visit Summary and Postpartum booklet. Activity: Advance as tolerated. Pelvic rest for 6 weeks.  Diet: routine diet Future Appointments: Future Appointments  Date Time Provider [place]  06/05/2022  8:45 AM [facility] None  06/05/2022  9:35 AM Constant, Vickii Chafe, MD [place] None   Follow up Visit:  Message sent 04/21/22 Please schedule this patient for a In person postpartum visit in 4 weeks with the following provider: Any provider. Additional Postpartum F/U:2 hour GTT  High risk pregnancy complicated by: GDM Delivery mode:  Vaginal, Spontaneous  Anticipated Birth Control:  Depo   Liliane Channel MD MPH OB Fellow, [place] for [place] 04/23/2022

## 2022-04-21 NOTE — Progress Notes (Signed)
Patient ID: Rebecca Holloway, female   DOB: 03/16/1991, 31 y.o.   MRN: [id number]  S: comfortable with epidural  O: BP 118/76   Pulse 82   Temp 98.8 F (37.1 C) (Axillary)   Resp 15   Ht 4\' 11"  (1.499 m)   Wt 128.1 kg   LMP 08/01/2021 (Approximate)   SpO2 98%   BMI 57.02 kg/m    SVE: 9.5/90/+1  FWB: 145bpm, moderate variablity, accels present, decels absent UC: regular, 3-4 mins Category I  Continue to titrate pitocin, maternal position changes Anticipate :NSVD

## 2022-04-21 NOTE — Lactation Note (Signed)
This note was copied from a baby's chart. Lactation Consultation Note  Patient Name: Rebecca Holloway Date: 04/21/2022 Reason for consult: L&D Initial assessment;Early term 37-38.6wks;Primapara;1st time breastfeeding;RN request;Breastfeeding assistance (LC visit to LD,baby STS on Birth parents chest, LC offered to assist , birth parent receptive. Lt areola to swollen for baby to latch, switched to the Rt, and w/ reverse pressure, more compressible, hand expressed sm drop, and baby licked it off. STS.) Age:49 hours Birth parent aware she will be seen on MBU  Maternal Data Has patient been taught Hand Expression?: Yes (small drop)  Feeding Mother's Current Feeding Choice: Breast Milk  LATCH Score Latch: Too sleepy or reluctant, no latch achieved, no sucking elicited.  Audible Swallowing: None  Type of Nipple: Everted at rest and after stimulation (semi erect, areola edema, reverse pressure helped)  Comfort (Breast/Nipple): Soft / non-tender  Hold (Positioning): Full assist, staff holds infant at breast  LATCH Score: 4   Lactation Tools Discussed/Used    Interventions Interventions: Breast feeding basics reviewed;Education;Assisted with latch;Skin to skin;Reverse pressure;Hand express;Adjust position  Discharge [facility] Program: Yes (per snapshot)  Consult Status Consult Status: Follow-up from L&D Date: 04/21/22 Follow-up type: In-patient    Rebecca Holloway 04/21/2022, 12:32 PM

## 2022-04-21 NOTE — Lactation Note (Signed)
This note was copied from a baby's chart. Lactation Consultation Note  Patient Name: Rebecca Holloway [id number] Date: 04/21/2022 Reason for consult: Initial assessment;1st time breastfeeding;Early term 37-38.6wks;Infant < 6lbs;Other (Comment);Maternal endocrine disorder (DM,ABO incompatablity, DAT+) Age:30 hours Infant not had any voids or stools yet. Birth Parent current feeding plan is breastfeeding and supplementing infant with donor breast milk. Birth Parent understands to limit infant total feedings to 30 minutes or less with latching at the breast and supplementing infant with donor breast milk. Base on infant's birth weight Birth Parent will latch infant at the breast and on day 1 supplement infant with or as much as infant wants per feeding. LC did not observe latch, Per Birth Parent infant did not latch in L&D, but on MBU infant latched for 20 minutes at 1500 pm, 10 minutes at 1700 pm and another 20 minutes at 2000 pm, Birth Parent had latch assistance with last feeding from Night Shift , RN.  [facility] set Birth Parent up with the Medela DEBP, Birth Parent decided used Hospital Grade pump instead of her DEBP from home. Birth Parent pumped 2 mls of EBM that was spoon feed to infant and then supplemented infant with 10 mls of donor breast milk using a slow flow bottle nipple ( purple and clear). Birth Parent understands EBM is safe at room temperature for 4 hours whereas donor milk must be used within 1 hour.  Birth Parent was made aware of O/P services, breastfeeding support groups, community resources, and our phone # for post-discharge questions.    Birth Parent Feeding Plan: 1- Birth Parent will BF infant by cues, 8+ times within 24 hrs, STS and limit total feeding ( Breast and supplementing infant with donor breast milk)to 30 minutes or less. 2- Birth Parent knows to call RN/LC if she needs any latch assistance. 3- Birth Parent will supplement infant with donor breast milk after every  feeding on day 1 ( 11 mls ) per feeding or more if infant wants it. 4- Birth Parent will continue to use DEBP every 3 hours for 15 minutes on initial setting and offer her EBM first before donor breast milk. Maternal Data Has patient been taught Hand Expression?: Yes Does the patient have breastfeeding experience prior to this delivery?: No  Feeding Mother's Current Feeding Choice: Breast Milk and Donor Milk  LATCH Score ( Done by RN) Latch: Repeated attempts needed to sustain latch, nipple held in mouth throughout feeding, stimulation needed to elicit sucking reflex.  Audible Swallowing: A few with stimulation  Type of Nipple: Everted at rest and after stimulation  Comfort (Breast/Nipple): Soft / non-tender  Hold (Positioning): Assistance needed to correctly position infant at breast and maintain latch.  LATCH Score: 7   Lactation Tools Discussed/Used Tools: Pump Breast pump type: Double-Electric Breast Pump Pump Education: Setup, frequency, and cleaning;Milk Storage Reason for Pumping: DAT+, less than 6 lbs, DM Birth Parent, ETI female infant. Pumping frequency: Birth Parent will pump every 3 hours for 15 minutes on inital setting. Pumped volume: 2 mL  Interventions Interventions: Breast feeding basics reviewed;Skin to skin;Expressed milk;DEBP;Education;Pace feeding;LC Services brochure  Discharge Pump: Personal (Birth Parent brought her DEBP from home but prefers to use Hospital grade DEBP today.)  Consult Status Consult Status: Follow-up Date: 04/22/22 Follow-up type: In-patient    Danelle Earthly 04/21/2022, 10:09 PM

## 2022-04-22 LAB — CBC
HCT: 32 % — ABNORMAL LOW (ref 36.0–46.0)
Hemoglobin: 10.9 g/dL — ABNORMAL LOW (ref 12.0–15.0)
MCH: 28.6 pg (ref 26.0–34.0)
MCHC: 34.1 g/dL (ref 30.0–36.0)
MCV: 84 fL (ref 80.0–100.0)
Platelets: 209 10*3/uL (ref 150–400)
RBC: 3.81 MIL/uL — ABNORMAL LOW (ref 3.87–5.11)
RDW: 14.5 % (ref 11.5–15.5)
WBC: 9.7 10*3/uL (ref 4.0–10.5)
nRBC: 0 % (ref 0.0–0.2)

## 2022-04-22 LAB — GLUCOSE, CAPILLARY
Glucose-Capillary: 101 mg/dL — ABNORMAL HIGH (ref 70–99)
Glucose-Capillary: 88 mg/dL (ref 70–99)

## 2022-04-22 MED ORDER — FERROUS SULFATE 325 (65 FE) MG PO TABS
325.0000 mg | ORAL_TABLET | ORAL | Status: DC
  Administered 2022-04-22: 325 mg via ORAL
  Filled 2022-04-22: qty 1

## 2022-04-22 NOTE — Lactation Note (Signed)
This note was copied from a baby's chart. Lactation Consultation Note  Patient Name: Rebecca Holloway [id number] Date: 04/22/2022 Reason for consult: Follow-up assessment;Mother's request;Primapara;1st time breastfeeding;Early term 37-38.6wks;Infant < 6lbs;Infant weight loss (1.26% WL) Age:30 hours  P1, Early Term, Infant Female, 1.26% WL  LC entered the room and the birth parent was getting ready to feed the infant.  The infant latched to the right breast in the football hold.  Lips were flanged, tongue was down, sucking was rhythmic, some swallows were noted.  The infant fed for 20 min at the breast and then took 10 mL of donor milk.  Per the birth parent, she would like to get some formula to feed the infant as a back-up in case she needs it.  RN was notified and will follow-up with the birth parent.  LC reinforced the importance of pumping for stimulation.  LC also spoke with the birth parent about cluster feeding.  All questions were answered.   Feeding Mother's Current Feeding Choice: Breast Milk and Donor Milk Nipple Type: Extra Slow Flow  LATCH Score Latch: Grasps breast easily, tongue down, lips flanged, rhythmical sucking.  Audible Swallowing: A few with stimulation  Type of Nipple: Everted at rest and after stimulation  Comfort (Breast/Nipple): Soft / non-tender  Hold (Positioning): No assistance needed to correctly position infant at breast.  LATCH Score: 9   Lactation Tools Discussed/Used    Interventions Interventions: Breast feeding basics reviewed;Education  Discharge    Consult Status Consult Status: Follow-up Date: 04/23/22 Follow-up type: In-patient    Lysbeth Penner 04/22/2022, 4:39 PM

## 2022-04-22 NOTE — Progress Notes (Addendum)
CSW received consult for financial and housing concerns and met with MOB at bedside to assess for resource needs. When CSW entered room, MOB was observed sitting on couch holding and bonding. MOB's "best friend" was present. CSW requested to speak with MOB alone. MOB provided verbal consent for CSW to speak in front of MOB's friend about anything. CSW introduced self and explained reasons for consult. MOB shares she is currently staying at the [facility] and was told that she is able to stay until she is 6 weeks postpartum. MOB shared she is interested in other resources for housing. MOB offered information about [employer] and offered to provide MOB with additional resources and contact information. MOB expressed interest. CSW returned with housing resources later in the afternoon. MOB reports she does receive food stamps and WIC but needs to re-certify now that infant has been born. CSW encouraged MOB to contact her caseworkers to notify them of infant's birth to have her benefits increased.   MOB shares she plans on moving with family to [facility] in about 6 weeks. MOB reports her grandmother passed away this morning and she plans to go stay with family. CSW expressed condolences. MOB shared her aunt also passed away recently and MOB was unable to attend her aunt's funeral due to being unable to travel because of her pregnancy. MOB identified additional stressors, marked by the need to have infant's DNA test to confirm paternity, which FOB has requested. MOB identified her best friend as a support. MOB was also seen by [facility] therapist Lynnea Ferrier at [facility] in the past. MOB declined additional resource needs at this time.   Signed,  Berniece Salines, MSW, [place], [place] 04/22/2022 5:28 PM

## 2022-04-22 NOTE — Progress Notes (Signed)
POSTPARTUM PROGRESS NOTE  Post Partum Day 1  Subjective:  Rebecca Holloway is a 31 y.o. G1P1001 s/p VD at [redacted]w[redacted]d.  She reports she is doing well. No acute events overnight. She denies any problems with ambulating, voiding or po intake. Denies nausea or vomiting.  Pain is well controlled.  Lochia is adequate.  Objective: Blood pressure 125/70, pulse 92, temperature 98.3 F (36.8 C), temperature source Oral, resp. rate 16, height 4\' 11"  (1.499 m), weight 128.1 kg, last menstrual period 08/01/2021, SpO2 100 %, unknown if currently breastfeeding.  Physical Exam:  General: alert, cooperative and no distress Chest: no respiratory distress Heart:regular rate, distal pulses intact Uterine Fundus: firm, appropriately tender DVT Evaluation: No calf swelling or tenderness Extremities: mild edema Skin: warm, dry  Recent Labs    04/19/22 1019 04/22/22 0412  HGB 12.1 10.9*  HCT 34.8* 32.0*    Assessment/Plan: Danille Oppedisano is a 31 y.o. G1P1001 s/p VD at [redacted]w[redacted]d   PPD#1 - Doing well  Routine postpartum care Anemia- Asymptomatic, start ferrous sulfate Contraception: depo Feeding: breast Dispo: Plan for discharge tomorrow GDM: CBG well controlled. CTM, no meds H/o DVT- cont lovenox ppx.   LOS: 3 days   Shelda Pal, DO OB Fellow  04/22/2022, 6:54 AM

## 2022-04-22 NOTE — Lactation Note (Signed)
This note was copied from a baby's chart. 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  Per the birth parent, the infant has been doing well with breastfeeding.  The birth parent stated that the infant has been feeding for 20 min at each feeding and drinking donor milk.  LC encouraged the birth parent to limit the total feeding to 30 min (breast and bottle combined).  LC also informed the birth parent that the volume of supplementation should increase due to being past the 24 hour mark.  LC encouraged the birth parent to pump after each feeding.  The RN stated that she had not seen a feeding today.  [place] asked the birth parent to call lactation when she is ready to feed.   Current Feeding Plan:  Breastfeeding 8+ times in 24 hours according to feeding cues.  Supplement according to Late preterm-infant supplementation guidelines. Pump after each feeding or q3hrs.  Call [place] for assistance with breastfeeding.   Feeding Mother's Current Feeding Choice: Breast Milk and Donor Milk Nipple Type: Extra Slow Flow  Interventions Interventions: Breast feeding basics reviewed;Education;LPT handout/interventions  Discharge    Consult Status Consult Status: Follow-up Date: 04/23/22 Follow-up type: In-patient    Lysbeth Penner 04/22/2022, 2:44 PM

## 2022-04-22 NOTE — Anesthesia Postprocedure Evaluation (Signed)
Anesthesia Post Note  Patient: Rebecca Holloway  Procedure(s) Performed: AN AD [place]     Patient location during evaluation: Mother Baby Anesthesia Type: Epidural Level of consciousness: awake and alert Pain management: pain level controlled Vital Signs Assessment: post-procedure vital signs reviewed and stable Respiratory status: spontaneous breathing, nonlabored ventilation and respiratory function stable Cardiovascular status: stable Postop Assessment: no headache, no backache and epidural receding Anesthetic complications: no   No notable events documented.  Last Vitals:  Vitals:   04/21/22 2340 04/22/22 0530  BP: 123/75 125/70  Pulse: (!) 103 92  Resp: 16 16  Temp: 36.7 C 36.8 C  SpO2: 99% 100%    Last Pain:  Vitals:   04/22/22 0727  TempSrc:   PainSc: 0-No pain   Pain Goal:                   Stefani Dama

## 2022-04-23 ENCOUNTER — Ambulatory Visit: Payer: Self-pay

## 2022-04-23 LAB — GLUCOSE, CAPILLARY: Glucose-Capillary: 89 mg/dL (ref 70–99)

## 2022-04-23 NOTE — Discharge Instructions (Addendum)
-   Please continue your blood thinner Lovenox daily for the next 6 weeks, to prevent blood clots. - Continue your prenatal vitamins especially if breastfeeding - Try to eat iron rich food. - Take over the counter tylenol (500mg ) or ibuprofen (200mg ) three times a day as needed for cramping/pain. - follow up in clinic in 4-6 weeks as scheduled for your regular post partum visit. - Please come back to [facility]U if you notice persistently elevated blood pressures or you start to have a headache, that doesn't get better with medications (tylenol and ibuprofen), rest (4hrs of sleep) and drinking water.

## 2022-04-23 NOTE — Lactation Note (Signed)
This note was copied from a baby's chart. Lactation Consultation Note  Patient Name: Rebecca Holloway Date: 04/23/2022 Reason for consult: 1st time breastfeeding;Follow-up assessment;Early term 37-38.6wks;Infant weight loss (-3% weight loss , infant is breastfeeding and supplementing with formula. DAT+, ABO incompatability, DM see Birth Parent's MR.) Age:30 hours Per Birth Parent , she doesn't;t have any BF questions or concerns for LC at this time, infant is latching well at breast most feedings are 15 to 20 minutes. LC did not observe latch at this time due to infant recently BF for 20 minutes and being supplemented with 23 mls of 22 kcal formula at 1440 pm. Birth Parent recently pumped 15 mls of EBM that she plans to offer infant at the next feeding after latching infant at the breast. Birth Parent will continue to pump every 3 hours for 15 minutes on initial setting and offer her EBM 1st before supplementing infant with formula.  LC explained to Birth Parent not to re-use bottle nipples they are only for one time usage.  Birth Parent's plan: 1- Continue to follow LPTI feeding guidelines. 2- Continue to BF infant 8+ times within 24 hrs, STS and then supplement infant on Day 3 : 48-72 hours (  with 22-24 mls after latching infant at the breast with EBM 1st and then formula.  3- Birth Parent knows to call [place] if their are any BF questions, concerns or if latch assistance is needed. 4- Birth Parent will continue to use DEBP every 3 hours for 15 minutes on initial setting.  Maternal Data    Feeding Mother's Current Feeding Choice: Breast Milk and Formula Nipple Type: Slow - flow  LATCH Score                    Lactation Tools Discussed/Used    Interventions Interventions: DEBP;Education;LPT handout/interventions  Discharge    Consult Status Consult Status: Follow-up Date: 04/24/22 Follow-up type: In-patient    Rebecca Holloway 04/23/2022, 4:40 PM

## 2022-04-24 ENCOUNTER — Encounter: Payer: Medicaid Other | Admitting: Obstetrics and Gynecology

## 2022-04-25 ENCOUNTER — Ambulatory Visit: Payer: Self-pay

## 2022-04-25 NOTE — Lactation Note (Signed)
This note was copied from a baby's chart. Lactation Consultation Note  Patient Name: Rebecca Holloway WCHEN'I Date: 04/25/2022 Reason for consult: Primapara;1st time breastfeeding;Early term 37-38.6wks;Infant < 6lbs;Infant weight loss;Breastfeeding assistance;Hyperbilirubinemia (2 % weight loss,) Age:30 days Serum Bilirubin-14.6,  As LC entered the room , Birth parent sitting in the chair near the window to expose the baby to the sunlight due to the Bilirubin being elevated.  Baby awake and hungry. Birth parent latched the baby and LC assisted to position better for depth on her side. LC showed mom breast compressions, and increased swallows noted and baby sustained the depth well . Per Birth parent comfortable.  Baby still feeding.  LC recommended - feed with feeding cues and by 3 hours if the baby isn't showing feeding cues , check the diaper, change if needed and work on latching.  If the baby latches - allow her to feed for 15 -20 mins ( 30 mins max ) and then supplement with EBM 30 ml pace feeding.  After baby is settled, post pump both breast for 15 mins/ save milk.  LC reviewed BF D/C teaching just in case baby is D/C today. If not D/C LC will see dyad tomorrow.  LC offered Birth parent and LC O/P and was receptive.   Maternal Data Has patient been taught Hand Expression?: Yes  Feeding Mother's Current Feeding Choice: Breast Milk Nipple Type: Slow - flow  LATCH Score Latch: Grasps breast easily, tongue down, lips flanged, rhythmical sucking. (worked in positioning  and depth at the breast)  Audible Swallowing: Spontaneous and intermittent  Type of Nipple: Everted at rest and after stimulation  Comfort (Breast/Nipple): Filling, red/small blisters or bruises, mild/mod discomfort  Hold (Positioning): Assistance needed to correctly position infant at breast and maintain latch.  LATCH Score: 8   Lactation Tools Discussed/Used Tools: Pump Breast pump type: Double-Electric Breast  Pump Pump Education: Milk Storage  Interventions Interventions: Breast feeding basics reviewed;Assisted with latch;Skin to skin;Breast massage;Hand express;Reverse pressure;Breast compression;Adjust position;Support pillows;Position options;DEBP;Hand pump;Education;LC Services brochure  Discharge Discharge Education: Engorgement and breast care;Warning signs for feeding baby;Outpatient recommendation;Outpatient Epic message sent;Other (comment) (mom aware she will receive a call to set up appt) Pump: DEBP;Personal;Manual  Consult Status Consult Status: Follow-up Date: 04/25/22 Follow-up type: In-patient    [place] 04/25/2022, 10:40 AM

## 2022-04-26 ENCOUNTER — Encounter: Payer: Self-pay | Admitting: Obstetrics and Gynecology

## 2022-04-29 ENCOUNTER — Telehealth (HOSPITAL_COMMUNITY): Payer: Self-pay

## 2022-04-29 NOTE — Telephone Encounter (Signed)
Patient reports doing fine. Patient reports having some cramping in lower abdomen. RN reviewed postpartum cramping and uterine involution process. RN also reviewed normal lochia amounts, color, and duration of bleeding. RN reviewed what to much bleeding looks like. Patient declines any other questions/concerns about her health and healing.  Patient reports that baby is doing good. Eating, peeing/pooping, and gaining weight well. "She gained 6 ounces at her pediatrician appointment." Baby sleeps in a Pack n Play. RN reviewed ABC's of safe sleep with patient. Patient declines any questions or concerns about baby.  EPDS score is 0.  Sharyn Lull [facility]  04/29/22,1436

## 2022-05-01 ENCOUNTER — Ambulatory Visit (INDEPENDENT_AMBULATORY_CARE_PROVIDER_SITE_OTHER): Payer: Medicaid Other | Admitting: *Deleted

## 2022-05-01 ENCOUNTER — Encounter: Payer: Medicaid Other | Admitting: Obstetrics and Gynecology

## 2022-05-01 VITALS — BP 139/85 | HR 105

## 2022-05-01 DIAGNOSIS — O165 Unspecified maternal hypertension, complicating the puerperium: Secondary | ICD-10-CM

## 2022-05-01 MED ORDER — FUROSEMIDE 20 MG PO TABS: 20.0000 mg | ORAL_TABLET | Freq: Two times a day (BID) | ORAL | 0 refills | Status: AC

## 2022-05-01 NOTE — Progress Notes (Signed)
Subjective:  Rebecca Holloway is a 31 y.o. female here for BP check.   Hypertension ROS: no TIA's, no chest pain on exertion, no dyspnea on exertion, and no swelling of ankles.    Objective:  LMP 08/01/2021 (Approximate)   Appearance alert, well appearing, and in no distress, oriented to person, place, and time, and overweight. General exam BP noted to be well controlled today in office.    Assessment:   Blood Pressure poorly controlled.   Plan:  Orders and follow up as documented in patient record.. Dr. Elly Modena was consulted. RX Lasix 20 mg per day X 5 days. Return for BP check in 1 week. Pt to resume Lovenox for DVT prevention X 6 weeks.

## 2022-05-08 ENCOUNTER — Encounter: Payer: Medicaid Other | Admitting: Obstetrics and Gynecology

## 2022-05-08 ENCOUNTER — Ambulatory Visit (INDEPENDENT_AMBULATORY_CARE_PROVIDER_SITE_OTHER): Payer: Medicaid Other | Admitting: General Practice

## 2022-05-08 DIAGNOSIS — I158 Other secondary hypertension: Secondary | ICD-10-CM

## 2022-05-08 NOTE — Progress Notes (Signed)
Subjective:  Rebecca Holloway is a 31 y.o. female here for BP check.   Hypertension ROS: taking medications as instructed, no medication side effects noted, no TIA's, no chest pain on exertion, no dyspnea on exertion, and no swelling of ankles.    Objective:  LMP 08/01/2021 (Approximate)   Appearance alert, well appearing, and in no distress. General exam BP noted to be well controlled today in office.    Assessment:   Blood Pressure well controlled.   Plan:  Current treatment plan is effective, no change in therapy. Pt completed Lasix and is to return 06-05-22 for postpartum visit per Mora Bellman, MD .

## 2022-06-05 ENCOUNTER — Other Ambulatory Visit: Payer: Medicaid Other

## 2022-06-05 ENCOUNTER — Telehealth: Payer: Medicaid Other | Admitting: Obstetrics and Gynecology

## 2022-06-05 NOTE — Progress Notes (Signed)
Unable to reach patient for virtual postpartum visit. Will reschedule

## 2023-05-04 ENCOUNTER — Other Ambulatory Visit: Payer: Self-pay | Admitting: Obstetrics and Gynecology

## 2023-05-04 DIAGNOSIS — O0990 Supervision of high risk pregnancy, unspecified, unspecified trimester: Secondary | ICD-10-CM
